# Patient Record
Sex: Female | Born: 1950
Health system: Southern US, Community
[De-identification: ages and names within clinical notes are randomized; demographics above are authoritative.]

## PROBLEM LIST (undated history)

## (undated) DIAGNOSIS — E785 Hyperlipidemia, unspecified: Secondary | ICD-10-CM

## (undated) DIAGNOSIS — J302 Other seasonal allergic rhinitis: Secondary | ICD-10-CM

## (undated) DIAGNOSIS — M810 Age-related osteoporosis without current pathological fracture: Secondary | ICD-10-CM

## (undated) HISTORY — DX: Hyperlipidemia, unspecified: E78.5

## (undated) HISTORY — DX: Age-related osteoporosis without current pathological fracture: M81.0

## (undated) HISTORY — PX: OTHER SURGICAL HISTORY: SHX169

## (undated) HISTORY — DX: Other seasonal allergic rhinitis: J30.2

## (undated) HISTORY — PX: BASAL CELL CARCINOMA EXCISION: SHX1214

---

## 1972-12-31 HISTORY — PX: WISDOM TOOTH EXTRACTION: SHX21

## 2009-12-18 ENCOUNTER — Emergency Department (HOSPITAL_BASED_OUTPATIENT_CLINIC_OR_DEPARTMENT_OTHER): Admission: EM | Admit: 2009-12-18 | Discharge: 2009-12-18 | Payer: Self-pay | Admitting: Emergency Medicine

## 2010-01-09 ENCOUNTER — Encounter: Admission: RE | Admit: 2010-01-09 | Discharge: 2010-01-09 | Payer: Self-pay | Admitting: Family Medicine

## 2010-11-27 ENCOUNTER — Emergency Department (HOSPITAL_BASED_OUTPATIENT_CLINIC_OR_DEPARTMENT_OTHER): Admission: EM | Admit: 2010-11-27 | Discharge: 2010-11-27 | Payer: Self-pay | Admitting: Emergency Medicine

## 2011-12-13 ENCOUNTER — Other Ambulatory Visit: Payer: Self-pay | Admitting: Endocrinology

## 2011-12-13 DIAGNOSIS — E049 Nontoxic goiter, unspecified: Secondary | ICD-10-CM

## 2011-12-18 ENCOUNTER — Ambulatory Visit
Admission: RE | Admit: 2011-12-18 | Discharge: 2011-12-18 | Disposition: A | Discharge status: Home / Self Care | Payer: 59 | Source: Ambulatory Visit | Attending: Endocrinology | Admitting: Endocrinology

## 2011-12-18 DIAGNOSIS — E049 Nontoxic goiter, unspecified: Secondary | ICD-10-CM

## 2013-09-30 ENCOUNTER — Other Ambulatory Visit: Payer: Self-pay | Admitting: Endocrinology

## 2013-09-30 DIAGNOSIS — E049 Nontoxic goiter, unspecified: Secondary | ICD-10-CM

## 2013-10-16 ENCOUNTER — Ambulatory Visit
Admission: RE | Admit: 2013-10-16 | Discharge: 2013-10-16 | Disposition: A | Discharge status: Home / Self Care | Payer: Managed Care, Other (non HMO) | Source: Ambulatory Visit | Attending: Endocrinology | Admitting: Endocrinology

## 2013-10-16 DIAGNOSIS — E049 Nontoxic goiter, unspecified: Secondary | ICD-10-CM

## 2015-06-30 ENCOUNTER — Other Ambulatory Visit: Payer: Self-pay | Admitting: Endocrinology

## 2015-06-30 DIAGNOSIS — E049 Nontoxic goiter, unspecified: Secondary | ICD-10-CM

## 2015-07-05 ENCOUNTER — Ambulatory Visit
Admission: RE | Admit: 2015-07-05 | Discharge: 2015-07-05 | Disposition: A | Discharge status: Home / Self Care | Payer: Managed Care, Other (non HMO) | Source: Ambulatory Visit | Attending: Endocrinology | Admitting: Endocrinology

## 2015-07-05 DIAGNOSIS — E049 Nontoxic goiter, unspecified: Secondary | ICD-10-CM

## 2016-10-26 ENCOUNTER — Other Ambulatory Visit: Payer: Self-pay | Admitting: Obstetrics and Gynecology

## 2016-10-26 DIAGNOSIS — M79621 Pain in right upper arm: Secondary | ICD-10-CM

## 2016-10-29 ENCOUNTER — Ambulatory Visit
Admission: RE | Admit: 2016-10-29 | Discharge: 2016-10-29 | Disposition: A | Discharge status: Home / Self Care | Payer: Managed Care, Other (non HMO) | Source: Ambulatory Visit | Attending: Obstetrics and Gynecology | Admitting: Obstetrics and Gynecology

## 2016-10-29 ENCOUNTER — Other Ambulatory Visit: Payer: Self-pay | Admitting: Obstetrics and Gynecology

## 2016-10-29 DIAGNOSIS — M79621 Pain in right upper arm: Secondary | ICD-10-CM

## 2016-11-13 LAB — HEMOGLOBIN A1C: Hemoglobin A1C: 5.1

## 2016-11-13 LAB — LIPID PANEL
Cholesterol: 234 — AB (ref 0–200)
HDL: 114 — AB (ref 35–70)
LDL CALC: 110
TRIGLYCERIDES: 51 (ref 40–160)

## 2016-11-13 LAB — VITAMIN D 25 HYDROXY (VIT D DEFICIENCY, FRACTURES): VIT D 25 HYDROXY: 32

## 2016-11-13 LAB — TSH: TSH: 2.41 (ref 0.41–5.90)

## 2017-03-13 ENCOUNTER — Other Ambulatory Visit: Payer: Self-pay | Admitting: Endocrinology

## 2017-03-13 DIAGNOSIS — E049 Nontoxic goiter, unspecified: Secondary | ICD-10-CM

## 2017-06-21 ENCOUNTER — Ambulatory Visit
Admission: RE | Admit: 2017-06-21 | Discharge: 2017-06-21 | Disposition: A | Discharge status: Home / Self Care | Payer: Managed Care, Other (non HMO) | Source: Ambulatory Visit | Attending: Endocrinology | Admitting: Endocrinology

## 2017-06-21 DIAGNOSIS — E049 Nontoxic goiter, unspecified: Secondary | ICD-10-CM

## 2018-05-13 LAB — LIPID PANEL
Cholesterol: 253 — AB (ref 0–200)
HDL: 94 — AB (ref 35–70)
LDL CALC: 143
Triglycerides: 64 (ref 40–160)

## 2018-05-13 LAB — HM PAP SMEAR: HM PAP: NEGATIVE

## 2018-05-13 LAB — HM MAMMOGRAPHY

## 2018-05-13 LAB — TSH: TSH: 2.06 (ref 0.41–5.90)

## 2018-05-13 LAB — VITAMIN D 25 HYDROXY (VIT D DEFICIENCY, FRACTURES): VIT D 25 HYDROXY: 33

## 2018-05-13 LAB — HEMOGLOBIN A1C: Hemoglobin A1C: 5.2

## 2018-06-11 LAB — HM DEXA SCAN

## 2018-08-25 DIAGNOSIS — E7800 Pure hypercholesterolemia, unspecified: Secondary | ICD-10-CM | POA: Diagnosis not present

## 2018-08-25 LAB — LIPID PANEL
CHOLESTEROL: 183 (ref 0–200)
HDL: 87 — AB (ref 35–70)
LDL CALC: 82
TRIGLYCERIDES: 70 (ref 40–160)

## 2018-09-30 ENCOUNTER — Ambulatory Visit: Payer: Self-pay | Admitting: Nurse Practitioner

## 2018-10-06 ENCOUNTER — Encounter: Payer: Self-pay | Admitting: Nurse Practitioner

## 2018-10-06 ENCOUNTER — Ambulatory Visit (INDEPENDENT_AMBULATORY_CARE_PROVIDER_SITE_OTHER): Payer: Medicare Other | Admitting: Nurse Practitioner

## 2018-10-06 VITALS — BP 120/76 | HR 73 | Temp 98.0°F | Ht 65.0 in | Wt 167.0 lb

## 2018-10-06 DIAGNOSIS — N3281 Overactive bladder: Secondary | ICD-10-CM | POA: Diagnosis not present

## 2018-10-06 DIAGNOSIS — Z6827 Body mass index (BMI) 27.0-27.9, adult: Secondary | ICD-10-CM | POA: Diagnosis not present

## 2018-10-06 DIAGNOSIS — J302 Other seasonal allergic rhinitis: Secondary | ICD-10-CM | POA: Diagnosis not present

## 2018-10-06 DIAGNOSIS — M810 Age-related osteoporosis without current pathological fracture: Secondary | ICD-10-CM | POA: Insufficient documentation

## 2018-10-06 DIAGNOSIS — E785 Hyperlipidemia, unspecified: Secondary | ICD-10-CM | POA: Insufficient documentation

## 2018-10-06 DIAGNOSIS — Z23 Encounter for immunization: Secondary | ICD-10-CM | POA: Diagnosis not present

## 2018-10-06 DIAGNOSIS — E782 Mixed hyperlipidemia: Secondary | ICD-10-CM

## 2018-10-06 MED ORDER — ZOSTER VAC RECOMB ADJUVANTED 50 MCG/0.5ML IM SUSR: 0.5000 mL | Freq: Once | INTRAMUSCULAR | 1 refills | Status: AC

## 2018-10-06 NOTE — Progress Notes (Signed)
Careteam: Patient Care Team: Lauree Chandler, NP as PCP - General (Geriatric Medicine) Hale Bogus., MD as Referring Physician (Gastroenterology) Marylynn Pearson, MD as Consulting Physician (Obstetrics and Gynecology) Bjorn Loser, MD as Consulting Physician (Urology) Jacelyn Pi, MD as Consulting Physician (Endocrinology)  Advanced Directive information Does Patient Have a Medical Advance Directive?: No  Allergies  Allergen Reactions  . Fish Allergy     Any fish in the flounder family causes rash, difficulty breathing, swelling of eyes, itching, water/swelling under eyes  . Penicillins     Swelling of the lips and a rash  . Tape     Localized rash    Chief Complaint  Patient presents with  . Medical Management of Chronic Issues    Pt is being seen so establish care. Pt has no previous PCP. Pt saw Dr. Claiborne Billings at [place] when she needed care.      HPI: Patient is a 67 y.o. female seen in the office today to establish care. Pt with another practice but not seen regularly. Has been seeing Dr Orvan Seen regularly who has done mammogram and bone density. Last PAP June or July of this year.   Dr Ivin Booty GI- has colonoscopy scheduled due to hx of polyps but none recently.   Osteoporsis- currently on fosamax, managed by Dr Orvan Seen. Has not been taking calicum and vit D - but has it at home. Has recently joined the gym for weight bearing activity.   Hyperlipidemia- taking atorvastatin 10 mg by mouth daily, at 3 month follow up LDL improved to 82 on last labs 07/2018  starting  routine exercise at [employer], likes to eat junk food. Lots of eating out.   Overactive bladder- following with urologist, on vesicare 10 mg daily  Seasonal allergies- taking claritin and flonase as needed   Hx of PCOS, partial hysterectomy.    Reports right lid issues- following with dermatologist- got steriodal cream that she will only use when it flares.  Has seen ophthalmologist but  ophthalmologist said it was contact dermatitis.  Mother recently died of dementia at 78 Review of Systems:  Review of Systems  Constitutional: Negative for chills, fever and weight loss.  HENT: Negative for tinnitus.   Respiratory: Negative for cough, sputum production and shortness of breath.   Cardiovascular: Negative for chest pain, palpitations and leg swelling.  Gastrointestinal: Negative for abdominal pain, constipation, diarrhea and heartburn.  Genitourinary: Negative for dysuria, frequency and urgency.  Musculoskeletal: Negative for back pain, falls, joint pain and myalgias.  Skin: Negative.   Neurological: Negative for dizziness and headaches.  Psychiatric/Behavioral: Negative for depression and memory loss. The patient does not have insomnia.     Past Medical History:  Diagnosis Date  . Hyperlipidemia   . Osteoporosis   . Seasonal allergies    Past Surgical History:  Procedure Laterality Date  . hysterectomy     Dr. Bartolo Darter  . WISDOM TOOTH EXTRACTION  1974   Social History:   reports that she has never smoked. She has never used smokeless tobacco. She reports that she drinks alcohol. She reports that she does not use drugs.  Family History  Problem Relation Age of Onset  . Dementia Mother 43  . Osteoporosis Mother   . Heart failure Father   . Atrial fibrillation Father   . Sick sinus syndrome Father   . Heart disease Father   . Diabetes Maternal Grandmother   . Arthritis Maternal Grandfather   . Dementia Maternal  Grandfather   . Cancer Paternal Grandmother   . Heart attack Paternal Uncle   . Stroke Paternal Aunt     Medications: Patient's Medications  New Prescriptions   No medications on file  Previous Medications   ALENDRONATE (FOSAMAX) 70 MG TABLET    TAKE 1 TABLET BY MOUTH ONCE WEEKLY   ATORVASTATIN (LIPITOR) 20 MG TABLET    Take 20 mg by mouth daily.   CALCIUM-PHOSPHORUS-VITAMIN D (CITRACAL +D3 PO)    Take 1 tablet by mouth daily.    CHOLECALCIFEROL (VITAMIN D3) 2000 UNITS TABS    Take 1 tablet by mouth daily.   FLUTICASONE (FLONASE) 50 MCG/ACT NASAL SPRAY    2 sprays by Each Nare route daily.   GLUCOSAMINE-CHONDROITIN PO    Take 1 tablet by mouth daily.   LORATADINE (CLARITIN) 10 MG TABLET    Take 10 mg by mouth.   SOLIFENACIN (VESICARE) 10 MG TABLET    Take 10 mg by mouth.   UNABLE TO FIND    Take 1 Dose by mouth daily. Nutrifil Optimal - V: heart, eyes, skin and lungs   UNABLE TO FIND    Take 1 Dose by mouth daily. Nutrifil Optimal- M: bones, nerves, and muscles  Modified Medications   Modified Medication Previous Medication   ZOSTER VACCINE ADJUVANTED (SHINGRIX) INJECTION Zoster Vaccine Adjuvanted [facility]) injection      Inject 0.5 mLs into the muscle once for 1 dose.    Inject 0.5 mLs into the muscle once.  Discontinued Medications   No medications on file     Physical Exam:  Vitals:   10/06/18 0910  BP: 120/76  Pulse: 73  Temp: 98 F (36.7 C)  TempSrc: Oral  SpO2: 97%  Weight: 167 lb (75.8 kg)  Height: 5\' 5"  (1.651 m)   Body mass index is 27.79 kg/m.  Physical Exam  Constitutional: She is oriented to person, place, and time. She appears well-developed and well-nourished. No distress.  HENT:  Head: Normocephalic and atraumatic.  Mouth/Throat: Oropharynx is clear and moist. No oropharyngeal exudate.  Eyes: Pupils are equal, round, and reactive to light. Conjunctivae are normal.  Neck: Normal range of motion. Neck supple.  Cardiovascular: Normal rate, regular rhythm and normal heart sounds.  Pulmonary/Chest: Effort normal and breath sounds normal.  Abdominal: Soft. Bowel sounds are normal.  Musculoskeletal: She exhibits no edema or tenderness.  Neurological: She is alert and oriented to person, place, and time.  Skin: Skin is warm and dry. She is not diaphoretic.  Psychiatric: She has a normal mood and affect.    Labs reviewed: Basic Metabolic Panel: No results for input(s): NA, K, CL, CO2,  GLUCOSE, BUN, CREATININE, CALCIUM, MG, PHOS, TSH in the last 8760 hours. Liver Function Tests: No results for input(s): AST, ALT, ALKPHOS, BILITOT, PROT, ALBUMIN in the last 8760 hours. No results for input(s): LIPASE, AMYLASE in the last 8760 hours. No results for input(s): AMMONIA in the last 8760 hours. CBC: No results for input(s): WBC, NEUTROABS, HGB, HCT, MCV, PLT in the last 8760 hours. Lipid Panel: No results for input(s): CHOL, HDL, LDLCALC, TRIG, CHOLHDL, LDLDIRECT in the last 8760 hours. TSH: No results for input(s): TSH in the last 8760 hours. A1C: No results found for: HGBA1C   Assessment/Plan 1. Need for influenza vaccination - Flu vaccine HIGH DOSE PF (Fluzone High dose)  2. Age-related osteoporosis without current pathological fracture Continues on fosamax, encouraged to take calcium and vit d routinely. To continue weight bearing activity.  3. Mixed hyperlipidemia Improvement in LDL on lipitor.   4. Overactive bladder Stable on vesicare, following with urology  5. Seasonal allergies -stable, uses claritin and flonase as needed  6. Body mass index 27.0-27.9, adult Starting more routine exercise and aware she needs to make dietary modifications.   Next appt: 4 weeks for welcome to medicare visit  Jessica K. [place], [facility] [phone]

## 2018-10-06 NOTE — Patient Instructions (Signed)
Will get flu shot today   When you come back we will get you pneumonia vaccine  Follow up in 4 weeks for welcome to medicare visit with Levi Crass (45 min appt)

## 2018-10-08 DIAGNOSIS — K5730 Diverticulosis of large intestine without perforation or abscess without bleeding: Secondary | ICD-10-CM | POA: Diagnosis not present

## 2018-10-08 DIAGNOSIS — Z1211 Encounter for screening for malignant neoplasm of colon: Secondary | ICD-10-CM | POA: Diagnosis not present

## 2018-10-08 DIAGNOSIS — D128 Benign neoplasm of rectum: Secondary | ICD-10-CM | POA: Diagnosis not present

## 2018-10-08 DIAGNOSIS — D126 Benign neoplasm of colon, unspecified: Secondary | ICD-10-CM | POA: Diagnosis not present

## 2018-10-08 DIAGNOSIS — K6389 Other specified diseases of intestine: Secondary | ICD-10-CM | POA: Diagnosis not present

## 2018-10-08 DIAGNOSIS — K621 Rectal polyp: Secondary | ICD-10-CM | POA: Diagnosis not present

## 2018-10-08 DIAGNOSIS — Z86010 Personal history of colonic polyps: Secondary | ICD-10-CM | POA: Diagnosis not present

## 2018-10-08 DIAGNOSIS — K648 Other hemorrhoids: Secondary | ICD-10-CM | POA: Diagnosis not present

## 2018-11-03 ENCOUNTER — Encounter: Payer: Self-pay | Admitting: Nurse Practitioner

## 2018-11-03 ENCOUNTER — Ambulatory Visit (INDEPENDENT_AMBULATORY_CARE_PROVIDER_SITE_OTHER): Payer: Medicare Other | Admitting: Nurse Practitioner

## 2018-11-03 ENCOUNTER — Ambulatory Visit
Admission: RE | Admit: 2018-11-03 | Discharge: 2018-11-03 | Disposition: A | Discharge status: Home / Self Care | Payer: Medicare Other | Source: Ambulatory Visit | Attending: Nurse Practitioner | Admitting: Nurse Practitioner

## 2018-11-03 ENCOUNTER — Other Ambulatory Visit: Payer: Self-pay | Admitting: Nurse Practitioner

## 2018-11-03 VITALS — BP 124/78 | HR 73 | Temp 98.3°F | Ht 65.0 in | Wt 164.4 lb

## 2018-11-03 DIAGNOSIS — M5137 Other intervertebral disc degeneration, lumbosacral region: Secondary | ICD-10-CM | POA: Diagnosis not present

## 2018-11-03 DIAGNOSIS — M549 Dorsalgia, unspecified: Principal | ICD-10-CM

## 2018-11-03 DIAGNOSIS — Z23 Encounter for immunization: Secondary | ICD-10-CM

## 2018-11-03 DIAGNOSIS — Z0000 Encounter for general adult medical examination without abnormal findings: Secondary | ICD-10-CM | POA: Diagnosis not present

## 2018-11-03 DIAGNOSIS — E782 Mixed hyperlipidemia: Secondary | ICD-10-CM | POA: Diagnosis not present

## 2018-11-03 DIAGNOSIS — M546 Pain in thoracic spine: Secondary | ICD-10-CM | POA: Diagnosis not present

## 2018-11-03 DIAGNOSIS — G8929 Other chronic pain: Secondary | ICD-10-CM

## 2018-11-03 DIAGNOSIS — M50323 Other cervical disc degeneration at C6-C7 level: Secondary | ICD-10-CM | POA: Diagnosis not present

## 2018-11-03 DIAGNOSIS — M50322 Other cervical disc degeneration at C5-C6 level: Secondary | ICD-10-CM | POA: Diagnosis not present

## 2018-11-03 DIAGNOSIS — M47812 Spondylosis without myelopathy or radiculopathy, cervical region: Secondary | ICD-10-CM | POA: Diagnosis not present

## 2018-11-03 DIAGNOSIS — M47817 Spondylosis without myelopathy or radiculopathy, lumbosacral region: Secondary | ICD-10-CM | POA: Diagnosis not present

## 2018-11-03 NOTE — Progress Notes (Signed)
Subjective:    Mary Short is a 67 y.o. female who presents for a Welcome to Medicare exam.   Cardiac Risk Factors include: advanced age (>48men, >73 women);dyslipidemia      Objective:    Today's Vitals   11/03/18 1044  BP: 124/78  Pulse: 73  Temp: 98.3 F (36.8 C)  TempSrc: Oral  SpO2: 96%  Weight: 164 lb 6.4 oz (74.6 kg)  Height: 5\' 5"  (1.651 m)  Body mass index is 27.36 kg/m.  Medications Outpatient Encounter Medications as of 11/03/2018  Medication Sig  . alendronate (FOSAMAX) 70 MG tablet TAKE 1 TABLET BY MOUTH ONCE WEEKLY  . atorvastatin (LIPITOR) 20 MG tablet Take 20 mg by mouth daily.  . Calcium-Phosphorus-Vitamin D (CITRACAL +D3 PO) Take 1 tablet by mouth daily.  . Cholecalciferol (VITAMIN D3) 2000 units TABS Take 1 tablet by mouth daily.  . fluticasone (FLONASE) 50 MCG/ACT nasal spray 2 sprays by Each Nare route daily.  Marland Kitchen GLUCOSAMINE-CHONDROITIN PO Take 1 tablet by mouth daily.  Marland Kitchen loratadine (CLARITIN) 10 MG tablet Take 10 mg by mouth.  . solifenacin (VESICARE) 10 MG tablet Take 10 mg by mouth.  Marland Kitchen UNABLE TO FIND Take 1 Dose by mouth daily. Nutrifil Optimal - V: heart, eyes, skin and lungs  . UNABLE TO FIND Take 1 Dose by mouth daily. Nutrifil Optimal- M: bones, nerves, and muscles   No facility-administered encounter medications on file as of 11/03/2018.      History: Past Medical History:  Diagnosis Date  . Hyperlipidemia   . Osteoporosis   . Seasonal allergies    Past Surgical History:  Procedure Laterality Date  . hysterectomy     Dr. Bartolo Darter  . WISDOM TOOTH EXTRACTION  1974    Family History  Problem Relation Age of Onset  . Dementia Mother 17  . Osteoporosis Mother   . Heart failure Father   . Atrial fibrillation Father   . Sick sinus syndrome Father   . Heart disease Father   . Diabetes Maternal Grandmother   . Arthritis Maternal Grandfather   . Dementia Maternal Grandfather   . Cancer Paternal Grandmother   . Heart attack  Paternal Uncle   . Stroke Paternal Aunt    Social History   Occupational History  . Not on file  Tobacco Use  . Smoking status: Never Smoker  . Smokeless tobacco: Never Used  Substance and Sexual Activity  . Alcohol use: Yes    Comment: generally 1 drink per month  . Drug use: Never  . Sexual activity: Not Currently    Tobacco Counseling Counseling given: Not Answered   Immunizations and Health Maintenance Immunization History  Administered Date(s) Administered  . Influenza, High Dose Seasonal PF 10/06/2018  . Influenza-Unspecified 10/29/2014, 12/17/2016  . Tdap 10/29/2014   Health Maintenance Due  Topic Date Due  . PAP SMEAR  07/25/1974  . COLONOSCOPY  07/26/2003  . PNA vac Low Risk Adult (1 of 2 - PCV13) 07/25/2018  . MAMMOGRAM  10/29/2018    Activities of Daily Living In your present state of health, do you have any difficulty performing the following activities: 11/03/2018  Hearing? Y  Comment would like to wait for referral to audiologist  Vision? Y  Comment problems readings with glasses, places to follow up wtih eye exam  Difficulty concentrating or making decisions? Y  Comment reports hx of ADHD  Walking or climbing stairs? Y  Comment has breathing issues with multiple stairs  Dressing or  bathing? N  Doing errands, shopping? N  Preparing Food and eating ? N  Using the Toilet? N  In the past six months, have you accidently leaked urine? Y  Do you have problems with loss of bowel control? N  Managing your Medications? N  Managing your Finances? N  Housekeeping or managing your Housekeeping? N  Some recent data might be hidden     Advanced Directives: Does Patient Have a Medical Advance Directive?: No Would patient like information on creating a medical advance directive?: Yes (MAU/Ambulatory/Procedural Areas - Information given)    Assessment:    This is a routine wellness examination for this patient   Vision/Hearing screen  Visual Acuity  Screening   Right eye Left eye Both eyes  Without correction:     With correction: 20/40 20/40 20/40   Comments: Plans to make a follow up with ophthalmetrist   Dietary issues and exercise activities discussed:  Current Exercise Habits: The patient does not participate in regular exercise at present, Exercise limited by: None identified  Goals    . Increase physical activity     Would like to do more lifestyle modification with exercise- more weight bearing activity.     . Weight (lb) < 150 lb (68 kg) (pt-stated)     Would like to lose 15 lbs       Depression Screen PHQ 2/9 Scores 10/06/2018  PHQ - 2 Score 0     Fall Risk Fall Risk  11/03/2018  Falls in the past year? 0    Cognitive Function: MMSE - Mini Mental State Exam 11/03/2018  Orientation to time 5  Orientation to Place 5  Registration 3  Attention/ Calculation 5  Recall 3  Language- name 2 objects 2  Language- repeat 1  Language- follow 3 step command 3  Language- read & follow direction 1  Write a sentence 1  Copy design 1  Total score 30        Patient Care Team: Lauree Chandler, NP as PCP - General (Geriatric Medicine) Hale Bogus., MD as Referring Physician (Gastroenterology) Marylynn Pearson, MD as Consulting Physician (Obstetrics and Gynecology) Bjorn Loser, MD as Consulting Physician (Urology) Jacelyn Pi, MD as Consulting Physician (Endocrinology)     Plan:    I have personally reviewed and noted the following in the patient's chart:   . Medical and social history . Use of alcohol, tobacco or illicit drugs  . Current medications and supplements . Functional ability and status . Nutritional status . Physical activity . Advanced directives . List of other physicians . Hospitalizations, surgeries, and ER visits in previous 12 months . Vitals . Screenings to include cognitive, depression, and falls . Referrals and appointments  In addition, I have reviewed and discussed  with patient certain preventive protocols, quality metrics, and best practice recommendations. A written personalized care plan for preventive services as well as general preventive health recommendations were provided to patient.     Lauree Chandler, NP 11/03/2018

## 2018-11-03 NOTE — Patient Instructions (Addendum)
To make follow up for back pain and to review lab work  Health Maintenance, Female Adopting a healthy lifestyle and getting preventive care can go a long way to promote health and wellness. Talk with your health care provider about what schedule of regular examinations is right for you. This is a good chance for you to check in with your provider about disease prevention and staying healthy. In between checkups, there are plenty of things you can do on your own. Experts have done a lot of research about which lifestyle changes and preventive measures are most likely to keep you healthy. Ask your health care provider for more information. Weight and diet Eat a healthy diet  Be sure to include plenty of vegetables, fruits, low-fat dairy products, and lean protein.  Do not eat a lot of foods high in solid fats, added sugars, or salt.  Get regular exercise. This is one of the most important things you can do for your health. ? Most adults should exercise for at least 150 minutes each week. The exercise should increase your heart rate and make you sweat (moderate-intensity exercise). ? Most adults should also do strengthening exercises at least twice a week. This is in addition to the moderate-intensity exercise.  Maintain a healthy weight  Body mass index (BMI) is a measurement that can be used to identify possible weight problems. It estimates body fat based on height and weight. Your health care provider can help determine your BMI and help you achieve or maintain a healthy weight.  For females 60 years of age and older: ? A BMI below 18.5 is considered underweight. ? A BMI of 18.5 to 24.9 is normal. ? A BMI of 25 to 29.9 is considered overweight. ? A BMI of 30 and above is considered obese.  Watch levels of cholesterol and blood lipids  You should start having your blood tested for lipids and cholesterol at 65 years of age, then have this test every 5 years.  You may need to have your  cholesterol levels checked more often if: ? Your lipid or cholesterol levels are high. ? You are older than 65 years of age. ? You are at high risk for heart disease.  Cancer screening Lung Cancer  Lung cancer screening is recommended for adults 75-20 years old who are at high risk for lung cancer because of a history of smoking.  A yearly low-dose CT scan of the lungs is recommended for people who: ? Currently smoke. ? Have quit within the past 15 years. ? Have at least a 30-pack-year history of smoking. A pack year is smoking an average of one pack of cigarettes a day for 1 year.  Yearly screening should continue until it has been 15 years since you quit.  Yearly screening should stop if you develop a health problem that would prevent you from having lung cancer treatment.  Breast Cancer  Practice breast self-awareness. This means understanding how your breasts normally appear and feel.  It also means doing regular breast self-exams. Let your health care provider know about any changes, no matter how small.  If you are in your 20s or 30s, you should have a clinical breast exam (CBE) by a health care provider every 1-3 years as part of a regular health exam.  If you are 4 or older, have a CBE every year. Also consider having a breast X-ray (mammogram) every year.  If you have a family history of breast cancer, talk to your  health care provider about genetic screening.  If you are at high risk for breast cancer, talk to your health care provider about having an MRI and a mammogram every year.  Breast cancer gene (BRCA) assessment is recommended for women who have family members with BRCA-related cancers. BRCA-related cancers include: ? Breast. ? Ovarian. ? Tubal. ? Peritoneal cancers.  Results of the assessment will determine the need for genetic counseling and BRCA1 and BRCA2 testing.  Cervical Cancer Your health care provider may recommend that you be screened regularly  for cancer of the pelvic organs (ovaries, uterus, and vagina). This screening involves a pelvic examination, including checking for microscopic changes to the surface of your cervix (Pap test). You may be encouraged to have this screening done every 3 years, beginning at age 79.  For women ages 29-65, health care providers may recommend pelvic exams and Pap testing every 3 years, or they may recommend the Pap and pelvic exam, combined with testing for human papilloma virus (HPV), every 5 years. Some types of HPV increase your risk of cervical cancer. Testing for HPV may also be done on women of any age with unclear Pap test results.  Other health care providers may not recommend any screening for nonpregnant women who are considered low risk for pelvic cancer and who do not have symptoms. Ask your health care provider if a screening pelvic exam is right for you.  If you have had past treatment for cervical cancer or a condition that could lead to cancer, you need Pap tests and screening for cancer for at least 20 years after your treatment. If Pap tests have been discontinued, your risk factors (such as having a new sexual partner) need to be reassessed to determine if screening should resume. Some women have medical problems that increase the chance of getting cervical cancer. In these cases, your health care provider may recommend more frequent screening and Pap tests.  Colorectal Cancer  This type of cancer can be detected and often prevented.  Routine colorectal cancer screening usually begins at 65 years of age and continues through 65 years of age.  Your health care provider may recommend screening at an earlier age if you have risk factors for colon cancer.  Your health care provider may also recommend using home test kits to check for hidden blood in the stool.  A small camera at the end of a tube can be used to examine your colon directly (sigmoidoscopy or colonoscopy). This is done to  check for the earliest forms of colorectal cancer.  Routine screening usually begins at age 48.  Direct examination of the colon should be repeated every 5-10 years through 65 years of age. However, you may need to be screened more often if early forms of precancerous polyps or small growths are found.  Skin Cancer  Check your skin from head to toe regularly.  Tell your health care provider about any new moles or changes in moles, especially if there is a change in a mole's shape or color.  Also tell your health care provider if you have a mole that is larger than the size of a pencil eraser.  Always use sunscreen. Apply sunscreen liberally and repeatedly throughout the day.  Protect yourself by wearing long sleeves, pants, a wide-brimmed hat, and sunglasses whenever you are outside.  Heart disease, diabetes, and high blood pressure  High blood pressure causes heart disease and increases the risk of stroke. High blood pressure is more likely to  develop in: ? People who have blood pressure in the high end of the normal range (130-139/85-89 mm Hg). ? People who are overweight or obese. ? People who are African American.  If you are 33-59 years of age, have your blood pressure checked every 3-5 years. If you are 66 years of age or older, have your blood pressure checked every year. You should have your blood pressure measured twice-once when you are at a hospital or clinic, and once when you are not at a hospital or clinic. Record the average of the two measurements. To check your blood pressure when you are not at a hospital or clinic, you can use: ? An automated blood pressure machine at a pharmacy. ? A home blood pressure monitor.  If you are between 20 years and 51 years old, ask your health care provider if you should take aspirin to prevent strokes.  Have regular diabetes screenings. This involves taking a blood sample to check your fasting blood sugar level. ? If you are at a  normal weight and have a low risk for diabetes, have this test once every three years after 65 years of age. ? If you are overweight and have a high risk for diabetes, consider being tested at a younger age or more often. Preventing infection Hepatitis B  If you have a higher risk for hepatitis B, you should be screened for this virus. You are considered at high risk for hepatitis B if: ? You were born in a country where hepatitis B is common. Ask your health care provider which countries are considered high risk. ? Your parents were born in a high-risk country, and you have not been immunized against hepatitis B (hepatitis B vaccine). ? You have HIV or AIDS. ? You use needles to inject street drugs. ? You live with someone who has hepatitis B. ? You have had sex with someone who has hepatitis B. ? You get hemodialysis treatment. ? You take certain medicines for conditions, including cancer, organ transplantation, and autoimmune conditions.  Hepatitis C  Blood testing is recommended for: ? Everyone born from [birth year] through 1963. ? Anyone with known risk factors for hepatitis C.  Sexually transmitted infections (STIs)  You should be screened for sexually transmitted infections (STIs) including gonorrhea and chlamydia if: ? You are sexually active and are younger than 65 years of age. ? You are older than 65 years of age and your health care provider tells you that you are at risk for this type of infection. ? Your sexual activity has changed since you were last screened and you are at an increased risk for chlamydia or gonorrhea. Ask your health care provider if you are at risk.  If you do not have HIV, but are at risk, it may be recommended that you take a prescription medicine daily to prevent HIV infection. This is called pre-exposure prophylaxis (PrEP). You are considered at risk if: ? You are sexually active and do not regularly use condoms or know the HIV status of your  partner(s). ? You take drugs by injection. ? You are sexually active with a partner who has HIV.  Talk with your health care provider about whether you are at high risk of being infected with HIV. If you choose to begin PrEP, you should first be tested for HIV. You should then be tested every 3 months for as long as you are taking PrEP. Pregnancy  If you are premenopausal and you may become pregnant,  ask your health care provider about preconception counseling.  If you may become pregnant, take 400 to 800 micrograms (mcg) of folic acid every day.  If you want to prevent pregnancy, talk to your health care provider about birth control (contraception). Osteoporosis and menopause  Osteoporosis is a disease in which the bones lose minerals and strength with aging. This can result in serious bone fractures. Your risk for osteoporosis can be identified using a bone density scan.  If you are 42 years of age or older, or if you are at risk for osteoporosis and fractures, ask your health care provider if you should be screened.  Ask your health care provider whether you should take a calcium or vitamin D supplement to lower your risk for osteoporosis.  Menopause may have certain physical symptoms and risks.  Hormone replacement therapy may reduce some of these symptoms and risks. Talk to your health care provider about whether hormone replacement therapy is right for you. Follow these instructions at home:  Schedule regular health, dental, and eye exams.  Stay current with your immunizations.  Do not use any tobacco products including cigarettes, chewing tobacco, or electronic cigarettes.  If you are pregnant, do not drink alcohol.  If you are breastfeeding, limit how much and how often you drink alcohol.  Limit alcohol intake to no more than 1 drink per day for nonpregnant women. One drink equals 12 ounces of beer, 5 ounces of wine, or 1 ounces of hard liquor.  Do not use street  drugs.  Do not share needles.  Ask your health care provider for help if you need support or information about quitting drugs.  Tell your health care provider if you often feel depressed.  Tell your health care provider if you have ever been abused or do not feel safe at home. This information is not intended to replace advice given to you by your health care provider. Make sure you discuss any questions you have with your health care provider. Document Released: 07/02/2011 Document Revised: 05/24/2016 Document Reviewed: 09/20/2015 Elsevier Interactive Patient Education  [employer].

## 2018-11-04 LAB — CBC WITH DIFFERENTIAL/PLATELET
BASOS ABS: 9 {cells}/uL (ref 0–200)
Basophils Relative: 0.3 %
EOS ABS: 90 {cells}/uL (ref 15–500)
Eosinophils Relative: 2.9 %
HEMATOCRIT: 44.4 % (ref 35.0–45.0)
Hemoglobin: 15.2 g/dL (ref 11.7–15.5)
LYMPHS ABS: 933 {cells}/uL (ref 850–3900)
MCH: 31.1 pg (ref 27.0–33.0)
MCHC: 34.2 g/dL (ref 32.0–36.0)
MCV: 90.8 fL (ref 80.0–100.0)
MPV: 9 fL (ref 7.5–12.5)
Monocytes Relative: 9.1 %
NEUTROS PCT: 57.6 %
Neutro Abs: 1786 cells/uL (ref 1500–7800)
Platelets: 214 10*3/uL (ref 140–400)
RBC: 4.89 10*6/uL (ref 3.80–5.10)
RDW: 11.9 % (ref 11.0–15.0)
TOTAL LYMPHOCYTE: 30.1 %
WBC mixed population: 282 cells/uL (ref 200–950)
WBC: 3.1 10*3/uL — ABNORMAL LOW (ref 3.8–10.8)

## 2018-11-04 LAB — COMPLETE METABOLIC PANEL WITH GFR
AG RATIO: 1.7 (calc) (ref 1.0–2.5)
ALKALINE PHOSPHATASE (APISO): 52 U/L (ref 33–130)
ALT: 32 U/L — ABNORMAL HIGH (ref 6–29)
AST: 28 U/L (ref 10–35)
Albumin: 4.6 g/dL (ref 3.6–5.1)
BUN: 19 mg/dL (ref 7–25)
CO2: 32 mmol/L (ref 20–32)
Calcium: 9.5 mg/dL (ref 8.6–10.4)
Chloride: 103 mmol/L (ref 98–110)
Creat: 0.81 mg/dL (ref 0.50–0.99)
GFR, EST NON AFRICAN AMERICAN: 76 mL/min/{1.73_m2} (ref >=60)
GFR, Est African American: 88 mL/min/{1.73_m2} (ref >=60)
GLOBULIN: 2.7 g/dL (ref 1.9–3.7)
Glucose, Bld: 82 mg/dL (ref 65–99)
Potassium: 4.2 mmol/L (ref 3.5–5.3)
SODIUM: 141 mmol/L (ref 135–146)
Total Bilirubin: 0.5 mg/dL (ref 0.2–1.2)
Total Protein: 7.3 g/dL (ref 6.1–8.1)

## 2018-11-06 ENCOUNTER — Encounter: Payer: Self-pay | Admitting: Nurse Practitioner

## 2018-11-06 ENCOUNTER — Ambulatory Visit (INDEPENDENT_AMBULATORY_CARE_PROVIDER_SITE_OTHER): Payer: Medicare Other | Admitting: Nurse Practitioner

## 2018-11-06 VITALS — BP 122/74 | HR 84 | Temp 98.4°F | Ht 65.0 in | Wt 166.6 lb

## 2018-11-06 DIAGNOSIS — M549 Dorsalgia, unspecified: Secondary | ICD-10-CM | POA: Diagnosis not present

## 2018-11-06 DIAGNOSIS — G8929 Other chronic pain: Secondary | ICD-10-CM

## 2018-11-06 MED ORDER — TIZANIDINE HCL 2 MG PO CAPS: 2.0000 mg | ORAL_CAPSULE | Freq: Three times a day (TID) | ORAL | 0 refills | Status: DC | PRN

## 2018-11-06 NOTE — Patient Instructions (Addendum)
Aleve 1 tablet twice daily routine for 7 days for flare Use tylenol 500 mg 1-2 tablets every 8 hours as needed for pain- (not to exceed 3000 mg in 24 hours)  To use heat to back ~30 mins three times daily and can use muscle rub AFTER heat to help with pain To use zanaflex 2 mg by mouth every 8 hours a needed muscle spasm

## 2018-11-06 NOTE — Progress Notes (Signed)
Careteam: Patient Care Team: Lauree Chandler, NP as PCP - General (Geriatric Medicine) Hale Bogus., MD as Referring Physician (Gastroenterology) Marylynn Pearson, MD as Consulting Physician (Obstetrics and Gynecology) Bjorn Loser, MD as Consulting Physician (Urology) Jacelyn Pi, MD as Consulting Physician (Endocrinology)  Advanced Directive information    Allergies  Allergen Reactions  . Fish Allergy     Any fish in the flounder family causes rash, difficulty breathing, swelling of eyes, itching, water/swelling under eyes  . Penicillins     Swelling of the lips and a rash  . Tape     Localized rash    Chief Complaint  Patient presents with  . Acute Visit    pt is being seen to discuss lab results and xray results.      HPI: Patient is a 67 y.o. female seen in the office today for lab results and xray.   Pt with chronic pain in her back, has known disc disease in her cervical spine but reports pain in worse in mid back through bra line. 10 days ago  (25th-27th of October) started having increased pain in mid back that she could not get under control for 3 days. Took muscle relaxer that she already had (cyclobenzaprine 10 mg) and ice to help get pain under control. No know injury but she was sewing and making halloween costumes for her grandchildren. Also use tylenol and aleve to help pain. Currently pain has improved but still there.   Some mild low back pain, had sciatica when she was in college. Is very careful with her lower back. Sits up straight and supports back with pillows. Review of Systems:  Review of Systems  Constitutional: Negative for chills, fever, malaise/fatigue and weight loss.  Musculoskeletal: Positive for back pain and myalgias. Negative for falls.    Past Medical History:  Diagnosis Date  . Hyperlipidemia   . Osteoporosis   . Seasonal allergies    Past Surgical History:  Procedure Laterality Date  . hysterectomy     Dr. Bartolo Darter  . WISDOM TOOTH EXTRACTION  1974   Social History:   reports that she has never smoked. She has never used smokeless tobacco. She reports that she drinks alcohol. She reports that she does not use drugs.  Family History  Problem Relation Age of Onset  . Dementia Mother 17  . Osteoporosis Mother   . Heart failure Father   . Atrial fibrillation Father   . Sick sinus syndrome Father   . Heart disease Father   . Diabetes Maternal Grandmother   . Arthritis Maternal Grandfather   . Dementia Maternal Grandfather   . Cancer Paternal Grandmother   . Heart attack Paternal Uncle   . Stroke Paternal Aunt     Medications: Patient's Medications  New Prescriptions   No medications on file  Previous Medications   ALENDRONATE (FOSAMAX) 70 MG TABLET    TAKE 1 TABLET BY MOUTH ONCE WEEKLY   ATORVASTATIN (LIPITOR) 20 MG TABLET    Take 20 mg by mouth daily.   CALCIUM-PHOSPHORUS-VITAMIN D (CITRACAL +D3 PO)    Take 1 tablet by mouth daily.   CHOLECALCIFEROL (VITAMIN D3) 2000 UNITS TABS    Take 1 tablet by mouth daily.   FLUTICASONE (FLONASE) 50 MCG/ACT NASAL SPRAY    2 sprays by Each Nare route daily.   GLUCOSAMINE-CHONDROITIN PO    Take 1 tablet by mouth daily.   LORATADINE (CLARITIN) 10 MG TABLET    Take  10 mg by mouth.   SOLIFENACIN (VESICARE) 10 MG TABLET    Take 10 mg by mouth.   UNABLE TO FIND    Take 1 Dose by mouth daily. Nutrifil Optimal - V: heart, eyes, skin and lungs   UNABLE TO FIND    Take 1 Dose by mouth daily. Nutrifil Optimal- M: bones, nerves, and muscles  Modified Medications   No medications on file  Discontinued Medications   No medications on file     Physical Exam:  Vitals:   11/06/18 1507  BP: 122/74  Pulse: 84  Temp: 98.4 F (36.9 C)  TempSrc: Oral  SpO2: 98%  Weight: 166 lb 9.6 oz (75.6 kg)  Height: 5\' 5"  (1.651 m)   Body mass index is 27.72 kg/m.  Physical Exam  Constitutional: She is oriented to person, place, and time. She appears  well-developed and well-nourished. No distress.  HENT:  Head: Normocephalic and atraumatic.  Mouth/Throat: Oropharynx is clear and moist. No oropharyngeal exudate.  Eyes: Pupils are equal, round, and reactive to light. Conjunctivae are normal.  Neck: Normal range of motion. Neck supple.  Cardiovascular: Normal rate, regular rhythm and normal heart sounds.  Pulmonary/Chest: Effort normal and breath sounds normal.  Abdominal: Soft. Bowel sounds are normal.  Musculoskeletal: She exhibits no edema.       Thoracic back: She exhibits tenderness.       Back:  Neurological: She is alert and oriented to person, place, and time.  Skin: Skin is warm and dry. She is not diaphoretic.  Psychiatric: She has a normal mood and affect.    Labs reviewed: Basic Metabolic Panel: Recent Labs    05/13/18 11/03/18 1157  NA  --  141  K  --  4.2  CL  --  103  CO2  --  32  GLUCOSE  --  82  BUN  --  19  CREATININE  --  0.81  CALCIUM  --  9.5  TSH 2.06  --    Liver Function Tests: Recent Labs    11/03/18 1157  AST 28  ALT 32*  BILITOT 0.5  PROT 7.3   No results for input(s): LIPASE, AMYLASE in the last 8760 hours. No results for input(s): AMMONIA in the last 8760 hours. CBC: Recent Labs    11/03/18 1157  WBC 3.1*  NEUTROABS 1,786  HGB 15.2  HCT 44.4  MCV 90.8  PLT 214   Lipid Panel: Recent Labs    05/13/18 08/25/18  CHOL 253* 183  HDL 94* 87*  LDLCALC 143 82  TRIG 64 70   TSH: Recent Labs    05/13/18  TSH 2.06   A1C: Lab Results  Component Value Date   HGBA1C 5.2 05/13/2018   Dg Cervical Spine Complete  Result Date: 11/03/2018 CLINICAL DATA:  Chronic pain for several years. EXAM: CERVICAL SPINE - COMPLETE 4+ VIEW COMPARISON:  None. FINDINGS: C1 to the superior endplate of T1 is imaged on the provided lateral radiograph. There is straightening and reversal of the expected cervical lordosis with kyphosis centered about the C4-C5 articulation. There is grade 1  anterolisthesis of C3 upon C4 measuring approximately 2 mm. The bilateral facets appear normally aligned. The dens appears normally positioned between the lateral masses of C1. Cervical vertebral body heights are preserved. Prevertebral soft tissues are normal. Moderate multilevel cervical spine DDD, worse at C5-C6 and C6-C7 with disc space height loss, endplate irregularity and sclerosis. Regional soft tissues appear normal. Limited visualization of the lung  apices is normal. IMPRESSION: 1. Straightening and reversal of the expected cervical lordosis, nonspecific though could be seen in setting of muscle spasm. 2. Moderate multilevel cervical spine DDD, worse at C5-C6 and C6-C7. Electronically Signed   By: Sandi Mariscal M.D.   On: 11/03/2018 15:04   Dg Thoracic Spine W/swimmers  Result Date: 11/03/2018 CLINICAL DATA:  Acute thoracolumbar pain for the past several months. EXAM: THORACIC SPINE - 3 VIEWS COMPARISON:  Cervical lumbar spine radiographs-earlier same day FINDINGS: Evaluation of the superior aspect of the thoracic spine as well as the cervicothoracic junction is degraded secondary to overlying osseous and soft tissue structures. There are 12 rib-bearing thoracic type vertebral bodies. Normal alignment of the thoracic spine. No definite evidence of anterolisthesis or retrolisthesis. No significant scoliotic curvature. Thoracic vertebral body heights appear preserved. Thoracic intervertebral disc space heights appear preserved. Limited visualization of the adjacent thorax is normal. Regional soft tissues appear normal. IMPRESSION: No explanation for patient's thoracic spine pain. Electronically Signed   By: Sandi Mariscal M.D.   On: 11/03/2018 14:59   Dg Lumbar Spine Complete  Result Date: 11/03/2018 CLINICAL DATA:  Chronic low back pain, worsened recently. EXAM: LUMBAR SPINE - COMPLETE 4+ VIEW COMPARISON:  None. FINDINGS: There are 5 non rib-bearing lumbar type vertebral bodies There is a mild scoliotic  curvature of the thoracolumbar spine with dominant caudal component convex the left measuring approximately 7 degrees (as measured from the superior endplate of T62 to the inferior endplate of L4), potentially positional. No anterolisthesis or retrolisthesis. No definite pars defects. Lumbar vertebral body heights appear preserved. Mild to moderate multilevel lumbar spine DDD, worse at L5-S1 with disc space height loss, endplate irregularity and sclerosis. Limited visualization of the bilateral SI joints is normal. Regional bowel gas pattern and soft tissues appear normal. IMPRESSION: Mild-to-moderate multilevel lumbar spine DDD, worse at L5-S1. Electronically Signed   By: Sandi Mariscal M.D.   On: 11/03/2018 15:01    Assessment/Plan 1. Chronic midline back pain, unspecified back location Aleve 1 tablet twice daily routine for 7 days for flare Use tylenol 500 mg 1-2 tablets every 8 hours as needed for pain- (not to exceed 3000 mg in 24 hours)  To use heat to back ~30 mins three times daily and can use muscle rub AFTER heat to help with pain To use zanaflex 2 mg by mouth every 8 hours a needed muscle spasm  - Ambulatory referral to Physical Therapy - tizanidine (ZANAFLEX) 2 MG capsule; Take 1 capsule (2 mg total) by mouth 3 (three) times daily as needed for muscle spasms.  Dispense: 60 capsule; Refill: 0  Next appt: as scheduled, sooner if needed  Vaibhav Fogleman K. [place], [facility] [phone]

## 2018-11-06 NOTE — Addendum Note (Signed)
Addended by: Lauree Chandler on: 11/06/2018 03:30 PM   Modules accepted: Level of Service

## 2018-11-19 ENCOUNTER — Ambulatory Visit: Payer: Medicare Other | Attending: Nurse Practitioner | Admitting: Physical Therapy

## 2018-11-19 ENCOUNTER — Encounter: Payer: Self-pay | Admitting: Physical Therapy

## 2018-11-19 DIAGNOSIS — M546 Pain in thoracic spine: Secondary | ICD-10-CM | POA: Diagnosis not present

## 2018-11-19 NOTE — Patient Instructions (Signed)

## 2018-11-19 NOTE — Therapy (Signed)
[place], [place], [address] Phone: [phone]   Fax:  [phone]  Physical Therapy Evaluation  Patient Details  Name: Mary Short MRN: [id number] Date of Birth: 05-17-1951 Referring Provider (PT): [name], NP   Encounter Date: 11/19/2018  PT End of Session - 11/19/18 0948    Visit Number  1    Number of Visits  12    Date for PT Re-Evaluation  01/14/19    Authorization Type  MCR    PT Start Time  0800    PT Stop Time  0855    PT Time Calculation (min)  55 min    Activity Tolerance  Patient tolerated treatment well    Behavior During Therapy  [facility] for tasks assessed/performed       Past Medical History:  Diagnosis Date  . Hyperlipidemia   . Osteoporosis   . Seasonal allergies     Past Surgical History:  Procedure Laterality Date  . hysterectomy     Dr. Bartolo Darter  . WISDOM TOOTH EXTRACTION  1974    There were no vitals filed for this visit.   Subjective Assessment - 11/19/18 0808    Subjective  Pt relays off an on muscle spasm and back pain. This flared up again back in October when she was sewing a lot. She had X-rays which showed cervical DDD, but negative for thoracic. 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     Pertinent History  PMH: cerv DDD on recent x-rays, Osteoporosis,    Limitations  Lifting;House hold activities    How long can you stand comfortably?  not limited    How long can you walk comfortably?  not limited    Diagnostic tests  xrays    Patient Stated Goals  get the pain down and manage it    Currently in Pain?  Yes    Pain Score  7    avg   Pain Location  Back    Pain Orientation  Right;Left;Mid    Pain Descriptors / Indicators  Spasm;Tightness;Sore    Pain Type  Chronic pain    Pain Radiating Towards  denies any N/T    Pain Onset  More than a month ago    Pain Frequency  Intermittent    Aggravating Factors   protraction with sitting and sewing, cleaning house    Pain Relieving Factors  meds, ice,heat,massage    Multiple Pain Sites  No         [facility] PT Assessment - 11/19/18 0001      Assessment   Medical Diagnosis  Thoracic pain    Referring Provider (PT)  [name], NP    Onset Date/Surgical Date  --   acute on chronic   Next MD Visit  6 months    Prior Therapy  PT 19 years ago for back      Precautions   Precautions  None      Restrictions   Weight Bearing Restrictions  No      Balance Screen   Has the patient fallen in the past 6 months  No      [place] residence    Additional Comments  stairs at home but no trouble with these      Prior Function   Level of Independence  Independent      Cognition   Overall Cognitive Status  Within Functional Limits for tasks assessed      Observation/Other Assessments   Focus on Therapeutic Outcomes (FOTO)   66%limited      ROM / Strength   AROM / PROM / Strength  AROM;Strength      AROM   AROM Assessment Site  Lumbar    Lumbar Flexion  WFL    Lumbar Extension  50%   pain both sides   Lumbar - Right Side Bend  50%   pain on Rt   Lumbar - Left Side Bend  75%    Lumbar - Right Rotation  50%   pain on Rt   Lumbar - Left Rotation  50%      Strength   Overall Strength Comments  UE strength 4+/5 grossly bilat shoulder and elbow      Flexibility   Soft Tissue Assessment /Muscle Length  --   tight lats and traps     Palpation   Spinal mobility  good mid-low T spine mobility, mild hypomobility upper T spine    Palpation comment  TTP traps,lats,rhomboids,supra/infraspinatus on Rt                Objective measurements completed on examination: See above findings.      [facility] Adult PT Treatment/Exercise - 11/19/18 0001      Self-Care   Self-Care  --   HEP, home TENS edu     Modalities   Modalities  Electrical  Stimulation;Moist Heat      Moist Heat Therapy   Number Minutes Moist Heat  12 Minutes    Moist Heat Location  Other (comment)   Thoracic     Electrical Stimulation   Electrical Stimulation Location  T spine Rt paraspinals, traps    Electrical Stimulation Action  IFC    Electrical Stimulation Parameters  tolerance, pt supine    Electrical Stimulation Goals  Pain             PT Education - 11/19/18 0850    Education Details  HEP,POC,TENS    Person(s) Educated  Patient    Methods  Explanation;Demonstration;Verbal cues;Handout    Comprehension  Verbalized understanding;Need further instruction       PT Short Term Goals - 11/19/18 0953      PT SHORT TERM GOAL #1   Title  Pt will be I and compliant with HEP.     Time  4    Period  Weeks        PT Long Term Goals - 11/19/18 0953      PT LONG TERM GOAL #1   Title  Pt will improve FOTO to less than 50% limited. 8 weeks 01/14/18    Status  New      PT LONG TERM GOAL #2   Title  Pt will improve lumbar ROM to [facility]. 8 weeks 01/14/18    Status  New      PT LONG TERM GOAL #3   Title  Pt will report no more than 3/10 pain with usual activity including sewing and housework. 8 weeks 01/14/18             Plan - 11/19/18 0949    Clinical Impression Statement  Pt presents with thoracic strain/sprain and spasm localized to Rt side. She had neg x-rays. She has decreased UE strength, increased thoracic muslce tightness and tenderness on Rt, decreased ROM and increased pain limiting her function.  She will benefit from killed PT to address her deficits.     History and Personal Factors relevant to plan of care:  PMH: cerv DDD on recent x-rays, Osteoporosis,    Clinical Presentation  Evolving    Clinical Presentation due to:  acute on chronic pain that has worsened    Clinical Decision Making  Moderate    Rehab Potential  Good    PT Frequency  2x / week    PT Duration  8 weeks    PT Treatment/Interventions   Cryotherapy;Electrical Stimulation;Iontophoresis 4mg /ml Dexamethasone;Moist Heat;Ultrasound;Therapeutic activities;Therapeutic exercise;Neuromuscular re-education;Manual techniques;Dry needling;Taping    PT Next Visit Plan  Thoracic and scapular stretching and strengthening, MT to Rt paraspinals, traps,lats, caution if try mobs due to osteoporosis/penia    Consulted and Agree with Plan of Care  Patient       Patient will benefit from skilled therapeutic intervention in order to improve the following deficits and impairments:  Decreased activity tolerance, Decreased endurance, Decreased range of motion, Decreased strength, Hypomobility, Impaired flexibility, Increased fascial restricitons, Increased muscle spasms, Pain  Visit Diagnosis: Pain in thoracic spine     Problem List Patient Active Problem List   Diagnosis Date Noted  . Osteoporosis 10/06/2018  . Hyperlipidemia 10/06/2018  . Overactive bladder 10/06/2018  . Seasonal allergies 10/06/2018    Debbe Odea, PT,DPT 11/19/2018, 10:03 AM  [place], [place], [address] Phone: [phone]   Fax:  [phone]  Name: Mary Short MRN: [id number] Date of Birth: August 22, 1951

## 2018-12-05 ENCOUNTER — Encounter: Payer: Self-pay | Admitting: Physical Therapy

## 2018-12-05 ENCOUNTER — Ambulatory Visit: Payer: Medicare Other | Attending: Nurse Practitioner | Admitting: Physical Therapy

## 2018-12-05 DIAGNOSIS — M62838 Other muscle spasm: Secondary | ICD-10-CM | POA: Insufficient documentation

## 2018-12-05 DIAGNOSIS — M546 Pain in thoracic spine: Secondary | ICD-10-CM | POA: Insufficient documentation

## 2018-12-05 NOTE — Therapy (Signed)
[place], [place], [address] Phone: [phone]   Fax:  [phone]  Physical Therapy Treatment  Patient Details  Name: Mary Short MRN: [id number] Date of Birth: Feb 28, 1951 Referring Provider (PT): [name], NP   Encounter Date: 12/05/2018  PT End of Session - 12/05/18 0835    Visit Number  2    Date for PT Re-Evaluation  01/14/19    Authorization Type  MCR    PT Start Time  0755    PT Stop Time  0851    PT Time Calculation (min)  56 min    Activity Tolerance  Patient tolerated treatment well    Behavior During Therapy  [facility] for tasks assessed/performed       Past Medical History:  Diagnosis Date  . Hyperlipidemia   . Osteoporosis   . Seasonal allergies     Past Surgical History:  Procedure Laterality Date  . hysterectomy     Dr. Bartolo Darter  . WISDOM TOOTH EXTRACTION  1974    There were no vitals filed for this visit.  Subjective Assessment - 12/05/18 0757    Subjective  Patient reports that she has been travelling and did well, but did not do much of the HEP.  She reports that cold weather does bother her.    Currently in Pain?  Yes    Pain Score  6     Pain Location  Back    Pain Orientation  Mid    Aggravating Factors   cold weather                       [facility] Adult PT Treatment/Exercise - 12/05/18 0001      Exercises   Exercises  Shoulder;Lumbar      Shoulder Exercises: Seated   Extension  Both;20 reps    Extension Weight (lbs)  3#    Row  Both;20 reps    Row Weight (lbs)  4    Horizontal ABduction  Both;20 reps    Horizontal ABduction Weight (lbs)  2      Shoulder Exercises: Standing   External Rotation  Both;20 reps;Theraband    Theraband Level (Shoulder External Rotation)  Level 2 (Red)    Other Standing Exercises  5# shrugs with upper trap and levator stretches      Shoulder Exercises: ROM/Strengthening   UBE (Upper Arm Bike)   4 minutes level 4    Lat Pull  1.5 plate;20 reps    Cybex Row  1 plate;20 reps      Moist Heat Therapy   Number Minutes Moist Heat  12 Minutes    Moist Heat Location  Cervical      Electrical Stimulation   Electrical Stimulation Location  T spine Rt paraspinals, traps    Electrical Stimulation Action  IFC    Electrical Stimulation Parameters  supine    Electrical Stimulation Goals  Pain      Manual Therapy   Manual Therapy  Manual Traction;Passive ROM    Passive ROM  shoulder depression, PROM to end range of the cervical area some gentle contract relax    Manual Traction  occipital release,                PT Short Term Goals - 11/19/18 0953      PT SHORT TERM GOAL #1   Title  Pt will be I and compliant with HEP.  Time  4    Period  Weeks        PT Long Term Goals - 12/05/18 4356      PT LONG TERM GOAL #1   Title  Pt will improve FOTO to less than 50% limited. 8 weeks 01/14/18    Status  On-going      PT LONG TERM GOAL #2   Title  Pt will improve lumbar ROM to [facility]. 8 weeks 01/14/18    Status  On-going            Plan - 12/05/18 0837    Clinical Impression Statement  Patient with tightness in the cervical spine especially right, she did well with the exercises, had some c/o tightness in the upper traps with the exercises, she has a very hard time relaxing and tends to elevate the shoulders    PT Next Visit Plan  continue to work on scapular stabilization    Consulted and Agree with Plan of Care  Patient       Patient will benefit from skilled therapeutic intervention in order to improve the following deficits and impairments:  Decreased activity tolerance, Decreased endurance, Decreased range of motion, Decreased strength, Hypomobility, Impaired flexibility, Increased fascial restricitons, Increased muscle spasms, Pain  Visit Diagnosis: Pain in thoracic spine     Problem List Patient Active Problem List   Diagnosis Date Noted  . Osteoporosis  10/06/2018  . Hyperlipidemia 10/06/2018  . Overactive bladder 10/06/2018  . Seasonal allergies 10/06/2018    Sumner Boast., PT 12/05/2018, 8:39 AM  [place], [place], [address] Phone: [phone]   Fax:  [phone]  Name: Mary Short MRN: [id number] Date of Birth: [birth year]

## 2018-12-08 ENCOUNTER — Encounter: Payer: Self-pay | Admitting: Physical Therapy

## 2018-12-08 ENCOUNTER — Ambulatory Visit: Payer: Medicare Other | Admitting: Physical Therapy

## 2018-12-08 DIAGNOSIS — M62838 Other muscle spasm: Secondary | ICD-10-CM | POA: Diagnosis not present

## 2018-12-08 DIAGNOSIS — M546 Pain in thoracic spine: Secondary | ICD-10-CM

## 2018-12-08 NOTE — Therapy (Signed)
[place], [place], [address] Phone: [phone]   Fax:  [phone]  Physical Therapy Treatment  Patient Details  Name: Mary Short MRN: [id number] Date of Birth: 04-22-1951 Referring Provider (PT): [name], NP   Encounter Date: 12/08/2018  PT End of Session - 12/08/18 0923    Visit Number  3    Date for PT Re-Evaluation  01/14/19    PT Start Time  0838    PT Stop Time  0935    PT Time Calculation (min)  57 min    Activity Tolerance  Patient tolerated treatment well    Behavior During Therapy  [facility] for tasks assessed/performed       Past Medical History:  Diagnosis Date  . Hyperlipidemia   . Osteoporosis   . Seasonal allergies     Past Surgical History:  Procedure Laterality Date  . hysterectomy     Dr. Bartolo Darter  . WISDOM TOOTH EXTRACTION  1974    There were no vitals filed for this visit.  Subjective Assessment - 12/08/18 0841    Subjective  Patient reports that she went to the gym and is more sore.  She reports that she may have done too much weight.  Reports more soreness in the neck and stiffness.  Head feels heavy    Currently in Pain?  Yes    Pain Score  6     Pain Location  Neck    Pain Descriptors / Indicators  Sore    Aggravating Factors   maybe the weights                       [facility] Adult PT Treatment/Exercise - 12/08/18 0001      Shoulder Exercises: Seated   Extension  Both;20 reps    Extension Weight (lbs)  3#    Row  Both;20 reps    Row Weight (lbs)  4    Horizontal ABduction  Both;20 reps    Horizontal ABduction Weight (lbs)  2      Shoulder Exercises: Standing   Other Standing Exercises  5# shrugs with upper trap and levator stretches      Shoulder Exercises: ROM/Strengthening   UBE (Upper Arm Bike)  5 minutes level 3    Lat Pull  1.5 plate;20 reps    Cybex Row  1 plate;20 reps      Moist Heat Therapy   Number  Minutes Moist Heat  12 Minutes    Moist Heat Location  Cervical      Electrical Stimulation   Electrical Stimulation Location  T spine Rt paraspinals, traps    Electrical Stimulation Action  IFC    Electrical Stimulation Parameters  supine    Electrical Stimulation Goals  Pain      Manual Therapy   Manual Therapy  Soft tissue mobilization    Soft tissue mobilization  to the right upper trap, cervical spine and into the rhomboids             PT Education - 12/08/18 0921    Education Details  gave information about Home traction and went over how to use    Person(s) Educated  Patient    Methods  Explanation    Comprehension  Verbalized understanding       PT Short Term Goals - 12/08/18 0926      PT SHORT TERM GOAL #1   Title  Pt will be I and compliant with HEP.     Status  Achieved        PT Long Term Goals - 12/05/18 0017      PT LONG TERM GOAL #1   Title  Pt will improve FOTO to less than 50% limited. 8 weeks 01/14/18    Status  On-going      PT LONG TERM GOAL #2   Title  Pt will improve lumbar ROM to [facility]. 8 weeks 01/14/18    Status  On-going            Plan - 12/08/18 0924    Clinical Impression Statement  Patient continues to have cervivcal tightness, she has significant knot in the right upper trap that is very tender.  Talked with her about DN, vs cervical traction, gave her information about both    PT Next Visit Plan  try dry needling, possibly traction    Consulted and Agree with Plan of Care  Patient       Patient will benefit from skilled therapeutic intervention in order to improve the following deficits and impairments:  Decreased activity tolerance, Decreased endurance, Decreased range of motion, Decreased strength, Hypomobility, Impaired flexibility, Increased fascial restricitons, Increased muscle spasms, Pain  Visit Diagnosis: Pain in thoracic spine  Other muscle spasm     Problem List Patient Active Problem List   Diagnosis Date  Noted  . Osteoporosis 10/06/2018  . Hyperlipidemia 10/06/2018  . Overactive bladder 10/06/2018  . Seasonal allergies 10/06/2018    Sumner Boast., PT 12/08/2018, 9:27 AM  [place], [place], [address] Phone: [phone]   Fax:  [phone]  Name: Mary Short MRN: [id number] Date of Birth: [birth year]

## 2018-12-08 NOTE — Patient Instructions (Signed)

## 2018-12-09 DIAGNOSIS — B078 Other viral warts: Secondary | ICD-10-CM | POA: Diagnosis not present

## 2018-12-09 DIAGNOSIS — L239 Allergic contact dermatitis, unspecified cause: Secondary | ICD-10-CM | POA: Diagnosis not present

## 2018-12-09 DIAGNOSIS — Z85828 Personal history of other malignant neoplasm of skin: Secondary | ICD-10-CM | POA: Diagnosis not present

## 2018-12-12 ENCOUNTER — Ambulatory Visit: Payer: Medicare Other | Admitting: Physical Therapy

## 2018-12-12 ENCOUNTER — Encounter: Payer: Self-pay | Admitting: Physical Therapy

## 2018-12-12 DIAGNOSIS — M62838 Other muscle spasm: Secondary | ICD-10-CM | POA: Diagnosis not present

## 2018-12-12 DIAGNOSIS — M546 Pain in thoracic spine: Secondary | ICD-10-CM

## 2018-12-12 NOTE — Therapy (Signed)
[place], [place], [address] Phone: [phone]   Fax:  [phone]  Physical Therapy Treatment  Patient Details  Name: Mary Short MRN: [id number] Date of Birth: 1951/06/10 Referring Provider (PT): [name], NP   Encounter Date: 12/12/2018  PT End of Session - 12/12/18 0841    Visit Number  4    Date for PT Re-Evaluation  01/14/19    PT Start Time  0758    PT Stop Time  0855    PT Time Calculation (min)  57 min    Activity Tolerance  Patient tolerated treatment well    Behavior During Therapy  [facility] for tasks assessed/performed       Past Medical History:  Diagnosis Date  . Hyperlipidemia   . Osteoporosis   . Seasonal allergies     Past Surgical History:  Procedure Laterality Date  . hysterectomy     Dr. Bartolo Darter  . WISDOM TOOTH EXTRACTION  1974    There were no vitals filed for this visit.  Subjective Assessment - 12/12/18 0801    Subjective  Patient reports that she had a second shingles shot and that she was very ill for a a few days.      Currently in Pain?  Yes    Pain Score  2     Pain Location  Neck                       [facility] Adult PT Treatment/Exercise - 12/12/18 0001      Shoulder Exercises: Supine   External Rotation  Both;20 reps;Theraband    Theraband Level (Shoulder External Rotation)  Level 2 (Red)    Other Supine Exercises  head on ball cervical retraction 3 ways      Shoulder Exercises: Standing   External Rotation  Both;20 reps;Theraband    Theraband Level (Shoulder External Rotation)  Level 2 (Red)    Other Standing Exercises  5# shrugs with upper trap and levator stretches    Other Standing Exercises  Money 20 reps, weighted ball overhead reach      Shoulder Exercises: ROM/Strengthening   UBE (Upper Arm Bike)  5 minutes level 3    Lat Pull  1.5 plate;20 reps    Cybex Row  1 plate;20 reps    "W" Arms  20 reps      Shoulder Exercises: Stretch   Corner Stretch  3 reps;20 seconds      Moist Heat Therapy   Number Minutes Moist Heat  12 Minutes    Moist Heat Location  Cervical      Electrical Stimulation   Electrical Stimulation Location  T spine Rt paraspinals, traps    Electrical Stimulation Action  IFC    Electrical Stimulation Parameters  supine    Electrical Stimulation Goals  Pain      Manual Therapy   Manual Therapy  Soft tissue mobilization    Soft tissue mobilization  to the right upper trap, cervical spine and into the rhomboids               PT Short Term Goals - 12/08/18 0926      PT SHORT TERM GOAL #1   Title  Pt will be I and compliant with HEP.     Status  Achieved        PT Long Term Goals - 12/12/18 2725      PT  LONG TERM GOAL #1   Title  Pt will improve FOTO to less than 50% limited. 8 weeks 01/14/18    Status  On-going      PT LONG TERM GOAL #2   Title  Pt will improve lumbar ROM to [facility]. 8 weeks 01/14/18    Status  Partially Met      PT LONG TERM GOAL #3   Title  Pt will report no more than 3/10 pain with usual activity including sewing and housework. 8 weeks 01/14/18    Status  On-going            Plan - 12/12/18 0842    Clinical Impression Statement  Patien reports that overall she is feeling much better, she c/o tightness in the pectoral area with the W exercises.  Gave her the corner stretches and she is very tight.  She tends to elevate the shoulders with exercises needing cues for retraction and not elevation, both verbal and tactile    PT Next Visit Plan  try dry needling, possibly traction    Consulted and Agree with Plan of Care  Patient       Patient will benefit from skilled therapeutic intervention in order to improve the following deficits and impairments:  Decreased activity tolerance, Decreased endurance, Decreased range of motion, Decreased strength, Hypomobility, Impaired flexibility, Increased fascial restricitons, Increased muscle  spasms, Pain  Visit Diagnosis: Pain in thoracic spine  Other muscle spasm     Problem List Patient Active Problem List   Diagnosis Date Noted  . Osteoporosis 10/06/2018  . Hyperlipidemia 10/06/2018  . Overactive bladder 10/06/2018  . Seasonal allergies 10/06/2018    Sumner Boast., PT 12/12/2018, 8:47 AM  [place], [place], [address] Phone: [phone]   Fax:  [phone]  Name: Mary Short MRN: [id number] Date of Birth: 1951/12/18

## 2018-12-15 ENCOUNTER — Encounter: Payer: Self-pay | Admitting: Physical Therapy

## 2018-12-15 ENCOUNTER — Ambulatory Visit: Payer: Medicare Other | Admitting: Physical Therapy

## 2018-12-15 DIAGNOSIS — M62838 Other muscle spasm: Secondary | ICD-10-CM

## 2018-12-15 DIAGNOSIS — M546 Pain in thoracic spine: Secondary | ICD-10-CM

## 2018-12-15 NOTE — Therapy (Signed)
[place], [place], [address] Phone: [phone]   Fax:  [phone]  Physical Therapy Treatment  Patient Details  Name: Mary Short MRN: [id number] Date of Birth: March 23, 1951 Referring Provider (PT): [name], NP   Encounter Date: 12/15/2018  PT End of Session - 12/15/18 0930    Visit Number  5    Date for PT Re-Evaluation  01/14/19    PT Start Time  0842    PT Stop Time  0943    PT Time Calculation (min)  61 min    Activity Tolerance  Patient tolerated treatment well    Behavior During Therapy  [facility] for tasks assessed/performed       Past Medical History:  Diagnosis Date  . Hyperlipidemia   . Osteoporosis   . Seasonal allergies     Past Surgical History:  Procedure Laterality Date  . hysterectomy     Dr. Bartolo Darter  . WISDOM TOOTH EXTRACTION  1974    There were no vitals filed for this visit.  Subjective Assessment - 12/15/18 0843    Subjective  Patient reports that she did a lot of sewing yesterday and was in a bad posistion, reports increased mid back pain with some pain increased on the right side    Currently in Pain?  Yes    Pain Score  4     Pain Location  Back    Pain Orientation  Upper;Mid    Pain Descriptors / Indicators  Aching    Aggravating Factors   sitting and bending over, sewing                       [facility] Adult PT Treatment/Exercise - 12/15/18 0001      Shoulder Exercises: Standing   Horizontal ABduction  Both;20 reps;Theraband    Theraband Level (Shoulder Horizontal ABduction)  Level 2 (Red)      Shoulder Exercises: ROM/Strengthening   UBE (Upper Arm Bike)  5 minutes level 3    Lat Pull  1.5 plate;20 reps    Cybex Row  1 plate;20 reps    "W" Arms  20 reps      Shoulder Exercises: Stretch   Corner Stretch  3 reps;20 seconds      Moist Heat Therapy   Number Minutes Moist Heat  15 Minutes    Moist Heat Location  Cervical       Electrical Stimulation   Electrical Stimulation Location  T spine Rt paraspinals, traps    Electrical Stimulation Action  IFC    Electrical Stimulation Parameters  supine    Electrical Stimulation Goals  Pain      Manual Therapy   Manual Therapy  Soft tissue mobilization    Soft tissue mobilization  to the right upper trap, cervical spine and into the rhomboids, more focus ont he rhomboids today       Trigger Point Dry Needling - 12/15/18 0929    Consent Given?  Yes    Education Handout Provided  Yes    Muscles Treated Upper Body  Upper trapezius;Rhomboids    Upper Trapezius Response  Twitch reponse elicited    Rhomboids Response  Twitch response elicited             PT Short Term Goals - 12/08/18 0926      PT SHORT TERM GOAL #1   Title  Pt will be I and compliant with HEP.  Status  Achieved        PT Long Term Goals - 12/12/18 0846      PT LONG TERM GOAL #1   Title  Pt will improve FOTO to less than 50% limited. 8 weeks 01/14/18    Status  On-going      PT LONG TERM GOAL #2   Title  Pt will improve lumbar ROM to [facility] 8 weeks 01/14/18    Status  Partially Met      PT LONG TERM GOAL #3   Title  Pt will report no more than 3/10 pain with usual activity including sewing and housework. 8 weeks 01/14/18    Status  On-going            Plan - 12/15/18 0930    Clinical Impression Statement  Patient with increased spasms in the upper trap and the right rhomboid.  She had good twitch response with DN.      PT Next Visit Plan  see how the DN did    Consulted and Agree with Plan of Care  Patient       Patient will benefit from skilled therapeutic intervention in order to improve the following deficits and impairments:  Decreased activity tolerance, Decreased endurance, Decreased range of motion, Decreased strength, Hypomobility, Impaired flexibility, Increased fascial restricitons, Increased muscle spasms, Pain  Visit Diagnosis: Pain in thoracic  spine  Other muscle spasm     Problem List Patient Active Problem List   Diagnosis Date Noted  . Osteoporosis 10/06/2018  . Hyperlipidemia 10/06/2018  . Overactive bladder 10/06/2018  . Seasonal allergies 10/06/2018    Sumner Boast., PT 12/15/2018, 9:31 AM  [place], [place], [address] Phone: [phone]   Fax:  [phone]  Name: Sheilia Reznick MRN: [id number] Date of Birth: 04/11/1951

## 2018-12-15 NOTE — Patient Instructions (Signed)

## 2018-12-19 ENCOUNTER — Encounter: Payer: Self-pay | Admitting: Physical Therapy

## 2018-12-19 ENCOUNTER — Ambulatory Visit: Payer: Medicare Other | Admitting: Physical Therapy

## 2018-12-19 DIAGNOSIS — M62838 Other muscle spasm: Secondary | ICD-10-CM

## 2018-12-19 DIAGNOSIS — M546 Pain in thoracic spine: Secondary | ICD-10-CM

## 2018-12-19 NOTE — Therapy (Signed)
[place] [address], [place], [address] Phone: [phone]   Fax:  [phone]  Physical Therapy Treatment  Patient Details  Name: Mary Short MRN: [id number] Date of Birth: [birth year] Referring Provider (PT): [name], NP   Encounter Date: 12/19/2018  PT End of Session - 12/19/18 0841    Visit Number  6    Date for PT Re-Evaluation  01/14/19    PT Start Time  0800    PT Stop Time  0900    PT Time Calculation (min)  60 min    Activity Tolerance  Patient tolerated treatment well    Behavior During Therapy  [facility] for tasks assessed/performed       Past Medical History:  Diagnosis Date  . Hyperlipidemia   . Osteoporosis   . Seasonal allergies     Past Surgical History:  Procedure Laterality Date  . hysterectomy     Dr. Bartolo Darter  . WISDOM TOOTH EXTRACTION  1974    There were no vitals filed for this visit.  Subjective Assessment - 12/19/18 0800    Subjective  Neck was feeling very good.  Mid back is still hurting    Currently in Pain?  Yes    Pain Score  4     Pain Location  Back    Pain Relieving Factors  what we are doing helps                       [facility] Adult PT Treatment/Exercise - 12/19/18 0001      Shoulder Exercises: Standing   Horizontal ABduction  Both;20 reps;Theraband    External Rotation  Both;20 reps;Theraband    Theraband Level (Shoulder External Rotation)  Level 2 (Red)    Other Standing Exercises  Money 20 reps, weighted ball overhead reach      Shoulder Exercises: ROM/Strengthening   UBE (Upper Arm Bike)  6 minutes level 3    Lat Pull  1.5 plate;20 reps    Cybex Row  1.5 plate;20 reps    "W" Arms  20 reps      Shoulder Exercises: Stretch   Corner Stretch  3 reps;20 seconds      Moist Heat Therapy   Number Minutes Moist Heat  15 Minutes    Moist Heat Location  Cervical      Electrical Stimulation   Electrical Stimulation Location   right rhomboids    Electrical Stimulation Action  IFC    Electrical Stimulation Parameters  supine    Electrical Stimulation Goals  Pain      Manual Therapy   Manual Therapy  Soft tissue mobilization    Soft tissue mobilization  Gentle PA and rotational mobs of the mid thoracic area, STM to the rhomboids and longissimus in this area       Trigger Point Dry Needling - 12/19/18 0840    Consent Given?  Yes    Upper Trapezius Response  Palpable increased muscle length    Rhomboids Response  Palpable increased muscle length             PT Short Term Goals - 12/08/18 0926      PT SHORT TERM GOAL #1   Title  Pt will be I and compliant with HEP.     Status  Achieved        PT Long Term Goals - 12/19/18 0844      PT LONG TERM  GOAL #2   Title  Pt will improve lumbar ROM to [facility]. 8 weeks 01/14/18    Status  Partially Met      PT LONG TERM GOAL #3   Title  Pt will report no more than 3/10 pain with usual activity including sewing and housework. 8 weeks 01/14/18    Status  Partially Met            Plan - 12/19/18 0842    Clinical Impression Statement  Patient continues to report doing well with neck but some increased right rhomboid area pain,  This area has spasms and is tender, tried some gentle joint mobs and resisted breathing to this area.    PT Next Visit Plan  work on the rhomboid area    Consulted and Agree with Plan of Care  Patient       Patient will benefit from skilled therapeutic intervention in order to improve the following deficits and impairments:  Decreased activity tolerance, Decreased endurance, Decreased range of motion, Decreased strength, Hypomobility, Impaired flexibility, Increased fascial restricitons, Increased muscle spasms, Pain  Visit Diagnosis: Pain in thoracic spine  Other muscle spasm     Problem List Patient Active Problem List   Diagnosis Date Noted  . Osteoporosis 10/06/2018  . Hyperlipidemia 10/06/2018  . Overactive bladder  10/06/2018  . Seasonal allergies 10/06/2018    Sumner Boast., PT 12/19/2018, 8:45 AM  [facility] [place], [place], [address] Phone: [phone]   Fax:  [phone]  Name: Mary Short MRN: [id number] Date of Birth: 1951-01-22

## 2018-12-22 ENCOUNTER — Ambulatory Visit: Payer: Medicare Other | Admitting: Physical Therapy

## 2018-12-22 ENCOUNTER — Encounter: Payer: Self-pay | Admitting: Physical Therapy

## 2018-12-22 DIAGNOSIS — M62838 Other muscle spasm: Secondary | ICD-10-CM

## 2018-12-22 DIAGNOSIS — M546 Pain in thoracic spine: Secondary | ICD-10-CM | POA: Diagnosis not present

## 2018-12-22 NOTE — Therapy (Signed)
[place], [place], [address] Phone: [phone]   Fax:  [phone]  Physical Therapy Treatment  Patient Details  Name: Mary Short MRN: [id number] Date of Birth: 27-Oct-1951 Referring Provider (PT): [name], NP   Encounter Date: 12/22/2018  PT End of Session - 12/22/18 0837    Visit Number  7    Date for PT Re-Evaluation  01/14/19    PT Start Time  0752    PT Stop Time  0852    PT Time Calculation (min)  60 min    Activity Tolerance  Patient tolerated treatment well    Behavior During Therapy  [facility] for tasks assessed/performed       Past Medical History:  Diagnosis Date  . Hyperlipidemia   . Osteoporosis   . Seasonal allergies     Past Surgical History:  Procedure Laterality Date  . hysterectomy     Dr. Bartolo Darter  . WISDOM TOOTH EXTRACTION  1974    There were no vitals filed for this visit.  Subjective Assessment - 12/22/18 0755    Subjective  I was a little sore after last time, but the next day it was good.  It is monday and it is raining, so a little sore today    Currently in Pain?  Yes    Pain Score  2     Pain Location  Back    Pain Orientation  Mid    Aggravating Factors   sitting, wrapping presents                       [facility] Adult PT Treatment/Exercise - 12/22/18 0001      Shoulder Exercises: ROM/Strengthening   UBE (Upper Arm Bike)  6 minutes level 3    Lat Pull  1.5 plate;20 reps    Cybex Row  1.5 plate;20 reps    "W" Arms  20 reps      Shoulder Exercises: Isometric Strengthening   Other Isometric Exercises  overhead 6# carry      Shoulder Exercises: Stretch   Corner Stretch  3 reps;20 seconds      Moist Heat Therapy   Number Minutes Moist Heat  15 Minutes    Moist Heat Location  Cervical      Electrical Stimulation   Electrical Stimulation Location  right rhomboids    Electrical Stimulation Action  IFC    Electrical  Stimulation Parameters  supine    Electrical Stimulation Goals  Pain      Manual Therapy   Manual Therapy  Soft tissue mobilization    Soft tissue mobilization  Gentle PA and rotational mobs of the mid thoracic area, STM to the rhomboids and longissimus in this area               PT Short Term Goals - 12/08/18 0926      PT SHORT TERM GOAL #1   Title  Pt will be I and compliant with HEP.     Status  Achieved        PT Long Term Goals - 12/22/18 7793      PT LONG TERM GOAL #1   Title  Pt will improve FOTO to less than 50% limited. 8 weeks 01/14/18    Status  Partially Met      PT LONG TERM GOAL #2   Title  Pt will improve lumbar ROM to [facility]. 8 weeks  01/14/18    Status  Partially Met            Plan - 12/22/18 0837    Clinical Impression Statement  Overall patient is pleased with her progress, she is still having some right rhomboid pain and there are obvious spasms in the area.  She will be doing a lot of traveling so we went over posture and easy exercises to do in the car to help with posture and pain    PT Next Visit Plan  work on the rhomboid area    Consulted and Agree with Plan of Care  Patient       Patient will benefit from skilled therapeutic intervention in order to improve the following deficits and impairments:  Decreased activity tolerance, Decreased endurance, Decreased range of motion, Decreased strength, Hypomobility, Impaired flexibility, Increased fascial restricitons, Increased muscle spasms, Pain  Visit Diagnosis: Pain in thoracic spine  Other muscle spasm     Problem List Patient Active Problem List   Diagnosis Date Noted  . Osteoporosis 10/06/2018  . Hyperlipidemia 10/06/2018  . Overactive bladder 10/06/2018  . Seasonal allergies 10/06/2018    Sumner Boast., PT 12/22/2018, 8:39 AM  [place], [place], [address] Phone: [phone]   Fax:   [phone]  Name: Mary Short MRN: [id number] Date of Birth: [birth year]

## 2018-12-26 ENCOUNTER — Ambulatory Visit: Payer: Medicare Other | Admitting: Physical Therapy

## 2019-03-04 DIAGNOSIS — H01119 Allergic dermatitis of unspecified eye, unspecified eyelid: Secondary | ICD-10-CM | POA: Diagnosis not present

## 2019-05-18 ENCOUNTER — Ambulatory Visit: Payer: Self-pay | Admitting: Nurse Practitioner

## 2019-05-18 ENCOUNTER — Ambulatory Visit (INDEPENDENT_AMBULATORY_CARE_PROVIDER_SITE_OTHER): Payer: Medicare Other | Admitting: Nurse Practitioner

## 2019-05-18 ENCOUNTER — Encounter: Payer: Self-pay | Admitting: Nurse Practitioner

## 2019-05-18 ENCOUNTER — Other Ambulatory Visit: Payer: Self-pay

## 2019-05-18 VITALS — BP 120/80 | HR 67 | Temp 98.1°F | Ht 65.0 in | Wt 170.2 lb

## 2019-05-18 DIAGNOSIS — M549 Dorsalgia, unspecified: Secondary | ICD-10-CM | POA: Diagnosis not present

## 2019-05-18 DIAGNOSIS — J302 Other seasonal allergic rhinitis: Secondary | ICD-10-CM | POA: Diagnosis not present

## 2019-05-18 DIAGNOSIS — E782 Mixed hyperlipidemia: Secondary | ICD-10-CM

## 2019-05-18 DIAGNOSIS — G8929 Other chronic pain: Secondary | ICD-10-CM | POA: Diagnosis not present

## 2019-05-18 DIAGNOSIS — M810 Age-related osteoporosis without current pathological fracture: Secondary | ICD-10-CM | POA: Diagnosis not present

## 2019-05-18 DIAGNOSIS — N3281 Overactive bladder: Secondary | ICD-10-CM | POA: Diagnosis not present

## 2019-05-18 LAB — COMPLETE METABOLIC PANEL WITH GFR
AG Ratio: 2 (calc) (ref 1.0–2.5)
ALT: 22 U/L (ref 6–29)
AST: 23 U/L (ref 10–35)
Albumin: 4.5 g/dL (ref 3.6–5.1)
Alkaline phosphatase (APISO): 39 U/L (ref 37–153)
BUN: 17 mg/dL (ref 7–25)
CO2: 32 mmol/L (ref 20–32)
Calcium: 9.4 mg/dL (ref 8.6–10.4)
Chloride: 106 mmol/L (ref 98–110)
Creat: 0.82 mg/dL (ref 0.50–0.99)
GFR, Est African American: 87 mL/min/{1.73_m2} (ref >=60)
GFR, Est Non African American: 75 mL/min/{1.73_m2} (ref >=60)
Globulin: 2.2 g/dL (calc) (ref 1.9–3.7)
Glucose, Bld: 94 mg/dL (ref 65–99)
Potassium: 4.3 mmol/L (ref 3.5–5.3)
Sodium: 142 mmol/L (ref 135–146)
Total Bilirubin: 0.5 mg/dL (ref 0.2–1.2)
Total Protein: 6.7 g/dL (ref 6.1–8.1)

## 2019-05-18 LAB — CBC WITH DIFFERENTIAL/PLATELET
Absolute Monocytes: 287 cells/uL (ref 200–950)
Basophils Absolute: 21 cells/uL (ref 0–200)
Basophils Relative: 0.6 %
Eosinophils Absolute: 140 cells/uL (ref 15–500)
Eosinophils Relative: 4 %
HCT: 40.1 % (ref 35.0–45.0)
Hemoglobin: 13.7 g/dL (ref 11.7–15.5)
Lymphs Abs: 910 cells/uL (ref 850–3900)
MCH: 31.1 pg (ref 27.0–33.0)
MCHC: 34.2 g/dL (ref 32.0–36.0)
MCV: 91.1 fL (ref 80.0–100.0)
MPV: 9 fL (ref 7.5–12.5)
Monocytes Relative: 8.2 %
Neutro Abs: 2142 cells/uL (ref 1500–7800)
Neutrophils Relative %: 61.2 %
Platelets: 188 10*3/uL (ref 140–400)
RBC: 4.4 10*6/uL (ref 3.80–5.10)
RDW: 12.1 % (ref 11.0–15.0)
Total Lymphocyte: 26 %
WBC: 3.5 10*3/uL — ABNORMAL LOW (ref 3.8–10.8)

## 2019-05-18 LAB — LIPID PANEL
Cholesterol: 188 mg/dL (ref <200)
HDL: 84 mg/dL (ref >=50)
LDL Cholesterol (Calc): 90 mg/dL (calc)
Non-HDL Cholesterol (Calc): 104 mg/dL (calc) (ref <130)
Total CHOL/HDL Ratio: 2.2 (calc) (ref <5.0)
Triglycerides: 55 mg/dL (ref <150)

## 2019-05-18 NOTE — Progress Notes (Signed)
Careteam: Patient Care Team: Lauree Chandler, NP as PCP - General (Geriatric Medicine) Hale Bogus., MD as Referring Physician (Gastroenterology) Marylynn Pearson, MD as Consulting Physician (Obstetrics and Gynecology) Bjorn Loser, MD as Consulting Physician (Urology) Jacelyn Pi, MD as Consulting Physician (Endocrinology)  Advanced Directive information Does Patient Have a Medical Advance Directive?: No, Does patient want to make changes to medical advance directive?: No - Patient declined  Allergies  Allergen Reactions  . Fish Allergy     Any fish in the flounder family causes rash, difficulty breathing, swelling of eyes, itching, water/swelling under eyes  . Penicillins     Swelling of the lips and a rash  . Tape     Localized rash    Chief Complaint  Patient presents with  . Medical Management of Chronic Issues    6 month follow-up. Patient c/o weight gain and no motivation   . Medication Refill    refill Lipitor #90 at [facility]   . Medication Management    Zanalex not helping      HPI: Patient is a 68 y.o. female seen in the office today for routine follow up   Dr Ivin Booty GI- had follow up with colonoscopy. Up to date.   Osteoporsis- currently on fosamax, managed by Dr Orvan Seen who is following bone density. Taking her calicum and vit D.   Hyperlipidemia- taking atorvastatin 10 mg by mouth daily, at 3 month follow up LDL improved to 82 on last labs 07/2018  Overactive bladder- following with urologist, controlled on vesicare 10 mg daily  Seasonal allergies- stable, taking claritin and flonase as needed   Reports right lid issues- following with dermatologist- got steriodal cream that she will only use when it flares.  Has seen ophthalmologist but ophthalmologist said it was contact dermatitis.  Chronic Midline back pain- flare in November and earlier this month. Did tylenol and aleve with heat and cold.  Took zquil and slept through the  pain.  Muscle relaxer did not help.  PT helped in the past. Had not been doing her shoulder exercise.  Warm water helped and ice packed helped.   Not exercise during quarantine.  Review of Systems:  Review of Systems  Constitutional: Negative for chills, fever, malaise/fatigue and weight loss.  HENT: Negative for tinnitus.   Respiratory: Negative for cough, sputum production and shortness of breath.   Cardiovascular: Negative for chest pain, palpitations and leg swelling.  Gastrointestinal: Negative for abdominal pain, constipation, diarrhea and heartburn.  Genitourinary: Negative for dysuria, frequency and urgency.  Musculoskeletal: Positive for back pain and myalgias. Negative for falls and joint pain.  Skin: Negative.   Neurological: Negative for dizziness and headaches.  Psychiatric/Behavioral: Negative for depression and memory loss. The patient does not have insomnia.     Past Medical History:  Diagnosis Date  . Hyperlipidemia   . Osteoporosis   . Seasonal allergies    Past Surgical History:  Procedure Laterality Date  . hysterectomy     Dr. Bartolo Darter  . WISDOM TOOTH EXTRACTION  1974   Social History:   reports that she has never smoked. She has never used smokeless tobacco. She reports current alcohol use. She reports that she does not use drugs.  Family History  Problem Relation Age of Onset  . Dementia Mother 45  . Osteoporosis Mother   . Heart failure Father   . Atrial fibrillation Father   . Sick sinus syndrome Father   . Heart disease Father   .  Diabetes Maternal Grandmother   . Arthritis Maternal Grandfather   . Dementia Maternal Grandfather   . Cancer Paternal Grandmother   . Heart attack Paternal Uncle   . Stroke Paternal Aunt     Medications: Patient's Medications  New Prescriptions   No medications on file  Previous Medications   ALENDRONATE (FOSAMAX) 70 MG TABLET    TAKE 1 TABLET BY MOUTH ONCE WEEKLY   ATORVASTATIN (LIPITOR) 20 MG  TABLET    Take 20 mg by mouth daily.   CALCIUM-PHOSPHORUS-VITAMIN D (CITRACAL +D3 PO)    Take 1 tablet by mouth daily.   CHOLECALCIFEROL (VITAMIN D3) 2000 UNITS TABS    Take 1 tablet by mouth daily.   FLUTICASONE (FLONASE) 50 MCG/ACT NASAL SPRAY    Place 2 sprays into both nostrils daily as needed. As needed   GLUCOSAMINE-CHONDROITIN PO    Take 1 tablet by mouth daily.   LORATADINE (CLARITIN) 10 MG TABLET    Take 10 mg by mouth as needed.    OMEGA-3 FATTY ACIDS (FISH OIL PO)    Take by mouth daily.   SOLIFENACIN (VESICARE) 10 MG TABLET    Take 10 mg by mouth.   TIZANIDINE (ZANAFLEX) 2 MG CAPSULE    Take 1 capsule (2 mg total) by mouth 3 (three) times daily as needed for muscle spasms.   UNABLE TO FIND    Take 1 Dose by mouth daily. Nutrifil Optimal - V: heart, eyes, skin and lungs   UNABLE TO FIND    Take 1 Dose by mouth daily. Nutrifil Optimal- M: bones, nerves, and muscles  Modified Medications   No medications on file  Discontinued Medications   No medications on file     Physical Exam:  Vitals:   05/18/19 1038  BP: 120/80  Pulse: 67  Temp: 98.1 F (36.7 C)  TempSrc: Oral  SpO2: 98%  Weight: 170 lb 3.2 oz (77.2 kg)  Height: 5\' 5"  (1.651 m)   Body mass index is 28.32 kg/m.  Physical Exam Constitutional:      General: She is not in acute distress.    Appearance: She is well-developed. She is not diaphoretic.  HENT:     Head: Normocephalic and atraumatic.     Mouth/Throat:     Pharynx: No oropharyngeal exudate.  Eyes:     Conjunctiva/sclera: Conjunctivae normal.     Pupils: Pupils are equal, round, and reactive to light.  Neck:     Musculoskeletal: Normal range of motion and neck supple.  Cardiovascular:     Rate and Rhythm: Normal rate and regular rhythm.     Heart sounds: Normal heart sounds.  Pulmonary:     Effort: Pulmonary effort is normal.     Breath sounds: Normal breath sounds.  Abdominal:     General: Bowel sounds are normal.     Palpations: Abdomen is  soft.  Musculoskeletal:        General: No tenderness.  Skin:    General: Skin is warm and dry.  Neurological:     Mental Status: She is alert and oriented to person, place, and time.    Labs reviewed: Basic Metabolic Panel: Recent Labs    11/03/18 1157  NA 141  K 4.2  CL 103  CO2 32  GLUCOSE 82  BUN 19  CREATININE 0.81  CALCIUM 9.5   Liver Function Tests: Recent Labs    11/03/18 1157  AST 28  ALT 32*  BILITOT 0.5  PROT 7.3   No  results for input(s): LIPASE, AMYLASE in the last 8760 hours. No results for input(s): AMMONIA in the last 8760 hours. CBC: Recent Labs    11/03/18 1157  WBC 3.1*  NEUTROABS 1,786  HGB 15.2  HCT 44.4  MCV 90.8  PLT 214   Lipid Panel: Recent Labs    08/25/18  CHOL 183  HDL 87*  LDLCALC 82  TRIG 70   TSH: No results for input(s): TSH in the last 8760 hours. A1C: Lab Results  Component Value Date   HGBA1C 5.2 05/13/2018     Assessment/Plan 1. Chronic midline back pain, unspecified back location -stable, recent flare earlier this month but currently without acute pain. Encouraged routine exercise to help strengthen back. - CBC with Differential/Platelet  2. Mixed hyperlipidemia -continues on Lipitor with dietary modifications.  - Lipid Panel - COMPLETE METABOLIC PANEL WITH GFR  3. Age-related osteoporosis without current pathological fracture Following with GYN. Continues on fosamax, calcium and vit D. Needs to increase   4. Overactive bladder Controlled on vesicare daily  5. Seasonal allergies Stable, uses Claritin as needed, has not needed to use flonase  Next appt: 6 months for routine follow up [place] [place] [phone]

## 2019-05-18 NOTE — Patient Instructions (Signed)
Increase exercise. Goal 30 mins 5 days a week.   DASH Eating Plan DASH stands for "Dietary Approaches to Stop Hypertension." The DASH eating plan is a healthy eating plan that has been shown to reduce high blood pressure (hypertension). It may also reduce your risk for type 2 diabetes, heart disease, and stroke. The DASH eating plan may also help with weight loss. What are tips for following this plan?  General guidelines  Avoid eating more than 2,300 mg (milligrams) of salt (sodium) a day. If you have hypertension, you may need to reduce your sodium intake to 1,500 mg a day.  Limit alcohol intake to no more than 1 drink a day for nonpregnant women and 2 drinks a day for men. One drink equals 12 oz of beer, 5 oz of wine, or 1 oz of hard liquor.  Work with your health care provider to maintain a healthy body weight or to lose weight. Ask what an ideal weight is for you.  Get at least 30 minutes of exercise that causes your heart to beat faster (aerobic exercise) most days of the week. Activities may include walking, swimming, or biking.  Work with your health care provider or diet and nutrition specialist (dietitian) to adjust your eating plan to your individual calorie needs. Reading food labels   Check food labels for the amount of sodium per serving. Choose foods with less than 5 percent of the Daily Value of sodium. Generally, foods with less than 300 mg of sodium per serving fit into this eating plan.  To find whole grains, look for the word "whole" as the first word in the ingredient list. Shopping  Buy products labeled as "low-sodium" or "no salt added."  Buy fresh foods. Avoid canned foods and premade or frozen meals. Cooking  Avoid adding salt when cooking. Use salt-free seasonings or herbs instead of table salt or sea salt. Check with your health care provider or pharmacist before using salt substitutes.  Do not fry foods. Cook foods using healthy methods such as baking,  boiling, grilling, and broiling instead.  Cook with heart-healthy oils, such as olive, canola, soybean, or sunflower oil. Meal planning  Eat a balanced diet that includes: ? 5 or more servings of fruits and vegetables each day. At each meal, try to fill half of your plate with fruits and vegetables. ? Up to 6-8 servings of whole grains each day. ? Less than 6 oz of lean meat, poultry, or fish each day. A 3-oz serving of meat is about the same size as a deck of cards. One egg equals 1 oz. ? 2 servings of low-fat dairy each day. ? A serving of nuts, seeds, or beans 5 times each week. ? Heart-healthy fats. Healthy fats called Omega-3 fatty acids are found in foods such as flaxseeds and coldwater fish, like sardines, salmon, and mackerel.  Limit how much you eat of the following: ? Canned or prepackaged foods. ? Food that is high in trans fat, such as fried foods. ? Food that is high in saturated fat, such as fatty meat. ? Sweets, desserts, sugary drinks, and other foods with added sugar. ? Full-fat dairy products.  Do not salt foods before eating.  Try to eat at least 2 vegetarian meals each week.  Eat more home-cooked food and less restaurant, buffet, and fast food.  When eating at a restaurant, ask that your food be prepared with less salt or no salt, if possible. What foods are recommended? The items listed  may not be a complete list. Talk with your dietitian about what dietary choices are best for you. Grains Whole-grain or whole-wheat bread. Whole-grain or whole-wheat pasta. Brown rice. [place]row. Bulgur. Whole-grain and low-sodium cereals. Pita bread. Low-fat, low-sodium crackers. Whole-wheat flour tortillas. Vegetables Fresh or frozen vegetables (raw, steamed, roasted, or grilled). Low-sodium or reduced-sodium tomato and vegetable juice. Low-sodium or reduced-sodium tomato sauce and tomato paste. Low-sodium or reduced-sodium canned vegetables. Fruits All fresh, dried, or  frozen fruit. Canned fruit in natural juice (without added sugar). Meat and other protein foods Skinless chicken or [place]. Ground chicken or [place]. Pork with fat trimmed off. Fish and seafood. Egg whites. Dried beans, peas, or lentils. Unsalted nuts, nut butters, and seeds. Unsalted canned beans. Lean cuts of beef with fat trimmed off. Low-sodium, lean deli meat. Dairy Low-fat (1%) or fat-free (skim) milk. Fat-free, low-fat, or reduced-fat cheeses. Nonfat, low-sodium ricotta or cottage cheese. Low-fat or nonfat yogurt. Low-fat, low-sodium cheese. Fats and oils Soft margarine without trans fats. Vegetable oil. Low-fat, reduced-fat, or light mayonnaise and salad dressings (reduced-sodium). Canola, safflower, olive, soybean, and sunflower oils. Avocado. Seasoning and other foods Herbs. Spices. Seasoning mixes without salt. Unsalted popcorn and pretzels. Fat-free sweets. What foods are not recommended? The items listed may not be a complete list. Talk with your dietitian about what dietary choices are best for you. Grains Baked goods made with fat, such as croissants, muffins, or some breads. Dry pasta or rice meal packs. Vegetables Creamed or fried vegetables. Vegetables in a cheese sauce. Regular canned vegetables (not low-sodium or reduced-sodium). Regular canned tomato sauce and paste (not low-sodium or reduced-sodium). Regular tomato and vegetable juice (not low-sodium or reduced-sodium). Angie Fava. Olives. Fruits Canned fruit in a light or heavy syrup. Fried fruit. Fruit in cream or butter sauce. Meat and other protein foods Fatty cuts of meat. Ribs. Fried meat. Berniece Salines. Sausage. Bologna and other processed lunch meats. Salami. Fatback. Hotdogs. Bratwurst. Salted nuts and seeds. Canned beans with added salt. Canned or smoked fish. Whole eggs or egg yolks. Chicken or [place] with skin. Dairy Whole or 2% milk, cream, and half-and-half. Whole or full-fat cream cheese. Whole-fat or sweetened yogurt.  Full-fat cheese. Nondairy creamers. Whipped toppings. Processed cheese and cheese spreads. Fats and oils Butter. Stick margarine. Lard. Shortening. Ghee. Bacon fat. Tropical oils, such as coconut, palm kernel, or palm oil. Seasoning and other foods Salted popcorn and pretzels. Onion salt, garlic salt, seasoned salt, table salt, and sea salt. Worcestershire sauce. Tartar sauce. Barbecue sauce. Teriyaki sauce. Soy sauce, including reduced-sodium. Steak sauce. Canned and packaged gravies. Fish sauce. Oyster sauce. Cocktail sauce. Horseradish that you find on the shelf. Ketchup. Mustard. Meat flavorings and tenderizers. Bouillon cubes. Hot sauce and Tabasco sauce. Premade or packaged marinades. Premade or packaged taco seasonings. Relishes. Regular salad dressings. Where to find more information:  National Heart, Lung, and [place]: https://wilson-eaton.com/  American Heart Association: www.heart.org Summary  The DASH eating plan is a healthy eating plan that has been shown to reduce high blood pressure (hypertension). It may also reduce your risk for type 2 diabetes, heart disease, and stroke.  With the DASH eating plan, you should limit salt (sodium) intake to 2,300 mg a day. If you have hypertension, you may need to reduce your sodium intake to 1,500 mg a day.  When on the DASH eating plan, aim to eat more fresh fruits and vegetables, whole grains, lean proteins, low-fat dairy, and heart-healthy fats.  Work with your health care provider or diet  and nutrition specialist (dietitian) to adjust your eating plan to your individual calorie needs. This information is not intended to replace advice given to you by your health care provider. Make sure you discuss any questions you have with your health care provider. Document Released: 12/06/2011 Document Revised: 12/10/2016 Document Reviewed: 12/10/2016 Elsevier Interactive Patient Education  2019 [employer].

## 2019-05-20 ENCOUNTER — Other Ambulatory Visit: Payer: Self-pay

## 2019-05-20 MED ORDER — ATORVASTATIN CALCIUM 20 MG PO TABS: 20.0000 mg | ORAL_TABLET | Freq: Every day | ORAL | 1 refills | Status: DC

## 2019-06-30 DIAGNOSIS — Z6829 Body mass index (BMI) 29.0-29.9, adult: Secondary | ICD-10-CM | POA: Diagnosis not present

## 2019-06-30 DIAGNOSIS — Z1231 Encounter for screening mammogram for malignant neoplasm of breast: Secondary | ICD-10-CM | POA: Diagnosis not present

## 2019-06-30 DIAGNOSIS — M8580 Other specified disorders of bone density and structure, unspecified site: Secondary | ICD-10-CM | POA: Diagnosis not present

## 2019-06-30 DIAGNOSIS — Z01419 Encounter for gynecological examination (general) (routine) without abnormal findings: Secondary | ICD-10-CM | POA: Diagnosis not present

## 2019-07-01 DIAGNOSIS — R350 Frequency of micturition: Secondary | ICD-10-CM | POA: Diagnosis not present

## 2019-07-01 DIAGNOSIS — N3946 Mixed incontinence: Secondary | ICD-10-CM | POA: Diagnosis not present

## 2019-07-06 DIAGNOSIS — E049 Nontoxic goiter, unspecified: Secondary | ICD-10-CM | POA: Diagnosis not present

## 2019-07-06 DIAGNOSIS — E039 Hypothyroidism, unspecified: Secondary | ICD-10-CM | POA: Diagnosis not present

## 2019-07-14 ENCOUNTER — Other Ambulatory Visit: Payer: Self-pay | Admitting: Endocrinology

## 2019-07-14 DIAGNOSIS — E049 Nontoxic goiter, unspecified: Secondary | ICD-10-CM

## 2019-07-22 ENCOUNTER — Ambulatory Visit
Admission: RE | Admit: 2019-07-22 | Discharge: 2019-07-22 | Disposition: A | Discharge status: Home / Self Care | Payer: Medicare Other | Source: Ambulatory Visit | Attending: Endocrinology | Admitting: Endocrinology

## 2019-07-22 ENCOUNTER — Other Ambulatory Visit: Payer: Self-pay

## 2019-07-22 ENCOUNTER — Other Ambulatory Visit: Payer: Self-pay | Admitting: *Deleted

## 2019-07-22 DIAGNOSIS — E049 Nontoxic goiter, unspecified: Secondary | ICD-10-CM

## 2019-07-22 DIAGNOSIS — E041 Nontoxic single thyroid nodule: Secondary | ICD-10-CM | POA: Diagnosis not present

## 2019-07-22 MED ORDER — ATORVASTATIN CALCIUM 20 MG PO TABS: 20.0000 mg | ORAL_TABLET | Freq: Every day | ORAL | 1 refills | Status: DC

## 2019-07-22 NOTE — Telephone Encounter (Signed)
Received fax request from [facility].

## 2019-10-12 DIAGNOSIS — Z23 Encounter for immunization: Secondary | ICD-10-CM | POA: Diagnosis not present

## 2019-11-09 ENCOUNTER — Ambulatory Visit: Payer: Self-pay

## 2019-11-09 ENCOUNTER — Other Ambulatory Visit: Payer: Self-pay

## 2019-11-09 ENCOUNTER — Ambulatory Visit (INDEPENDENT_AMBULATORY_CARE_PROVIDER_SITE_OTHER): Payer: Medicare Other | Admitting: Nurse Practitioner

## 2019-11-09 ENCOUNTER — Encounter: Payer: Self-pay | Admitting: Nurse Practitioner

## 2019-11-09 VITALS — BP 118/76 | HR 77 | Temp 98.0°F | Ht 65.0 in | Wt 172.0 lb

## 2019-11-09 DIAGNOSIS — Z0001 Encounter for general adult medical examination with abnormal findings: Secondary | ICD-10-CM

## 2019-11-09 DIAGNOSIS — E782 Mixed hyperlipidemia: Secondary | ICD-10-CM

## 2019-11-09 DIAGNOSIS — Z23 Encounter for immunization: Secondary | ICD-10-CM

## 2019-11-09 DIAGNOSIS — M810 Age-related osteoporosis without current pathological fracture: Secondary | ICD-10-CM | POA: Diagnosis not present

## 2019-11-09 DIAGNOSIS — D729 Disorder of white blood cells, unspecified: Secondary | ICD-10-CM | POA: Diagnosis not present

## 2019-11-09 DIAGNOSIS — Z1159 Encounter for screening for other viral diseases: Secondary | ICD-10-CM

## 2019-11-09 DIAGNOSIS — Z0000 Encounter for general adult medical examination without abnormal findings: Secondary | ICD-10-CM

## 2019-11-09 NOTE — Patient Instructions (Addendum)
[name] , Thank you for taking time to come for your Medicare Wellness Visit. I appreciate your ongoing commitment to your health goals. Please review the following plan we discussed and let me know if I can assist you in the future.   Screening recommendations/referrals: Colonoscopy up to date Mammogram up to date  Bone Density up to date Recommended yearly ophthalmology/optometry visit for glaucoma screening and checkup Recommended yearly dental visit for hygiene and checkup  Vaccinations: Influenza vaccine up to date Pneumococcal vaccine up to date, Pneumococcal 23 given today  Tdap vaccine up to date Shingles vaccine up to date    Advanced directives: recommended you complete and bring back to office to have on fie.   Conditions/risks identified: weight gain noted, encouraged to increase physical activity   Next appointment: 1 year   Preventive Care 65 Years and Older, Female Preventive care refers to lifestyle choices and visits with your health care provider that can promote health and wellness. What does preventive care include?  A yearly physical exam. This is also called an annual well check.  Dental exams once or twice a year.  Routine eye exams. Ask your health care provider how often you should have your eyes checked.  Personal lifestyle choices, including:  Daily care of your teeth and gums.  Regular physical activity.  Eating a healthy diet.  Avoiding tobacco and drug use.  Limiting alcohol use.  Practicing safe sex.  Taking low-dose aspirin every day.  Taking vitamin and mineral supplements as recommended by your health care provider. What happens during an annual well check? The services and screenings done by your health care provider during your annual well check will depend on your age, overall health, lifestyle risk factors, and family history of disease. Counseling  Your health care provider may ask you questions about your:  Alcohol  use.  Tobacco use.  Drug use.  Emotional well-being.  Home and relationship well-being.  Sexual activity.  Eating habits.  History of falls.  Memory and ability to understand (cognition).  Work and work Statistician.  Reproductive health. Screening  You may have the following tests or measurements:  Height, weight, and BMI.  Blood pressure.  Lipid and cholesterol levels. These may be checked every 5 years, or more frequently if you are over 14 years old.  Skin check.  Lung cancer screening. You may have this screening every year starting at age 10 if you have a 30-pack-year history of smoking and currently smoke or have quit within the past 15 years.  Fecal occult blood test (FOBT) of the stool. You may have this test every year starting at age 36.  Flexible sigmoidoscopy or colonoscopy. You may have a sigmoidoscopy every 5 years or a colonoscopy every 10 years starting at age 23.  Hepatitis C blood test.  Hepatitis B blood test.  Sexually transmitted disease (STD) testing.  Diabetes screening. This is done by checking your blood sugar (glucose) after you have not eaten for a while (fasting). You may have this done every 1-3 years.  Bone density scan. This is done to screen for osteoporosis. You may have this done starting at age 36.  Mammogram. This may be done every 1-2 years. Talk to your health care provider about how often you should have regular mammograms. Talk with your health care provider about your test results, treatment options, and if necessary, the need for more tests. Vaccines  Your health care provider may recommend certain vaccines, such as:  Influenza  vaccine. This is recommended every year.  Tetanus, diphtheria, and acellular pertussis (Tdap, Td) vaccine. You may need a Td booster every 10 years.  Zoster vaccine. You may need this after age 85.  Pneumococcal 13-valent conjugate (PCV13) vaccine. One dose is recommended after age 85.   Pneumococcal polysaccharide (PPSV23) vaccine. One dose is recommended after age 70. Talk to your health care provider about which screenings and vaccines you need and how often you need them. This information is not intended to replace advice given to you by your health care provider. Make sure you discuss any questions you have with your health care provider. Document Released: 01/13/2016 Document Revised: 09/05/2016 Document Reviewed: 10/18/2015 Elsevier Interactive Patient Education  2017 [employer] Prevention in the Home Falls can cause injuries. They can happen to people of all ages. There are many things you can do to make your home safe and to help prevent falls. What can I do on the outside of my home?  Regularly fix the edges of walkways and driveways and fix any cracks.  Remove anything that might make you trip as you walk through a door, such as a raised step or threshold.  Trim any bushes or trees on the path to your home.  Use bright outdoor lighting.  Clear any walking paths of anything that might make someone trip, such as rocks or tools.  Regularly check to see if handrails are loose or broken. Make sure that both sides of any steps have handrails.  Any raised decks and porches should have guardrails on the edges.  Have any leaves, snow, or ice cleared regularly.  Use sand or salt on walking paths during winter.  Clean up any spills in your garage right away. This includes oil or grease spills. What can I do in the bathroom?  Use night lights.  Install grab bars by the toilet and in the tub and shower. Do not use towel bars as grab bars.  Use non-skid mats or decals in the tub or shower.  If you need to sit down in the shower, use a plastic, non-slip stool.  Keep the floor dry. Clean up any water that spills on the floor as soon as it happens.  Remove soap buildup in the tub or shower regularly.  Attach bath mats securely with double-sided non-slip  rug tape.  Do not have throw rugs and other things on the floor that can make you trip. What can I do in the bedroom?  Use night lights.  Make sure that you have a light by your bed that is easy to reach.  Do not use any sheets or blankets that are too big for your bed. They should not hang down onto the floor.  Have a firm chair that has side arms. You can use this for support while you get dressed.  Do not have throw rugs and other things on the floor that can make you trip. What can I do in the kitchen?  Clean up any spills right away.  Avoid walking on wet floors.  Keep items that you use a lot in easy-to-reach places.  If you need to reach something above you, use a strong step stool that has a grab bar.  Keep electrical cords out of the way.  Do not use floor polish or wax that makes floors slippery. If you must use wax, use non-skid floor wax.  Do not have throw rugs and other things on the floor that can  make you trip. What can I do with my stairs?  Do not leave any items on the stairs.  Make sure that there are handrails on both sides of the stairs and use them. Fix handrails that are broken or loose. Make sure that handrails are as long as the stairways.  Check any carpeting to make sure that it is firmly attached to the stairs. Fix any carpet that is loose or worn.  Avoid having throw rugs at the top or bottom of the stairs. If you do have throw rugs, attach them to the floor with carpet tape.  Make sure that you have a light switch at the top of the stairs and the bottom of the stairs. If you do not have them, ask someone to add them for you. What else can I do to help prevent falls?  Wear shoes that:  Do not have high heels.  Have rubber bottoms.  Are comfortable and fit you well.  Are closed at the toe. Do not wear sandals.  If you use a stepladder:  Make sure that it is fully opened. Do not climb a closed stepladder.  Make sure that both sides of  the stepladder are locked into place.  Ask someone to hold it for you, if possible.  Clearly mark and make sure that you can see:  Any grab bars or handrails.  First and last steps.  Where the edge of each step is.  Use tools that help you move around (mobility aids) if they are needed. These include:  Canes.  Walkers.  Scooters.  Crutches.  Turn on the lights when you go into a dark area. Replace any light bulbs as soon as they burn out.  Set up your furniture so you have a clear path. Avoid moving your furniture around.  If any of your floors are uneven, fix them.  If there are any pets around you, be aware of where they are.  Review your medicines with your doctor. Some medicines can make you feel dizzy. This can increase your chance of falling. Ask your doctor what other things that you can do to help prevent falls. This information is not intended to replace advice given to you by your health care provider. Make sure you discuss any questions you have with your health care provider. Document Released: 10/13/2009 Document Revised: 05/24/2016 Document Reviewed: 01/21/2015 Elsevier Interactive Patient Education  2017 [employer].

## 2019-11-09 NOTE — Progress Notes (Signed)
Subjective:   Mary Short is a 68 y.o. female who presents for Medicare Annual (Subsequent) preventive examination.  Review of Systems:   Cardiac Risk Factors include: dyslipidemia;advanced age (>36men, >53 women);sedentary lifestyle     Objective:     Vitals: BP 118/76   Pulse 77   Temp 98 F (36.7 C) (Temporal)   Ht 5\' 5"  (1.651 m)   Wt 172 lb (78 kg)   SpO2 97%   BMI 28.62 kg/m   Body mass index is 28.62 kg/m.  Advanced Directives 05/18/2019 11/19/2018 11/03/2018 11/03/2018 10/06/2018  Does Patient Have a Medical Advance Directive? No No No No No  Does patient want to make changes to medical advance directive? No - Patient declined - - - -  Would patient like information on creating a medical advance directive? - No - Patient declined Yes (MAU/Ambulatory/Procedural Areas - Information given) - -    Tobacco Social History   Tobacco Use  Smoking Status Never Smoker  Smokeless Tobacco Never Used     Counseling given: Not Answered   Clinical Intake:  Pre-visit preparation completed: Yes  Pain : No/denies pain     BMI - recorded: 28.62 Nutritional Status: BMI 25 -29 Overweight Nutritional Risks: Unintentional weight gain Diabetes: No  How often do you need to have someone help you when you read instructions, pamphlets, or other written materials from your doctor or pharmacy?: 1 - Never What is the last grade level you completed in school?: masters degree  Interpreter Needed?: No     Past Medical History:  Diagnosis Date  . Hyperlipidemia   . Osteoporosis   . Seasonal allergies    Past Surgical History:  Procedure Laterality Date  . hysterectomy     Dr. Bartolo Darter  . WISDOM TOOTH EXTRACTION  1974   Family History  Problem Relation Age of Onset  . Dementia Mother 77  . Osteoporosis Mother   . Heart failure Father   . Atrial fibrillation Father   . Sick sinus syndrome Father   . Heart disease Father   . Diabetes Maternal Grandmother    . Arthritis Maternal Grandfather   . Dementia Maternal Grandfather   . Cancer Paternal Grandmother   . Heart attack Paternal Uncle   . Stroke Paternal Aunt    Social History   Socioeconomic History  . Marital status: Married    Spouse name: Not on file  . Number of children: Not on file  . Years of education: Not on file  . Highest education level: Not on file  Occupational History  . Not on file  Social Needs  . Financial resource strain: Not on file  . Food insecurity    Worry: Not on file    Inability: Not on file  . Transportation needs    Medical: Not on file    Non-medical: Not on file  Tobacco Use  . Smoking status: Never Smoker  . Smokeless tobacco: Never Used  Substance and Sexual Activity  . Alcohol use: Yes    Comment: generally 1 drink per month  . Drug use: Never  . Sexual activity: Not Currently  Lifestyle  . Physical activity    Days per week: Not on file    Minutes per session: Not on file  . Stress: Not on file  Relationships  . Social Herbalist on phone: Not on file    Gets together: Not on file    Attends religious service: Not on  file    Active member of club or organization: Not on file    Attends meetings of clubs or organizations: Not on file    Relationship status: Not on file  Other Topics Concern  . Not on file  Social History Narrative   Social History      Diet? Low carb/sugar      Do you drink/eat things with caffeine? Iced tea, chocolate      Marital status?   married          What year were you married? 1977      Do you live in a house, apartment, assisted living, condo, trailer, etc.? house      Is it one or more stories? 2      How many persons live in your home? 2      Do you have any pets in your home? (please list) 2 dogs      Highest level of education completed? Masters degree      Current or past profession: Museum/gallery curator, Microbiologist      Do you exercise?         Very little                              Type & how often? Walk, some weights      Advanced Directives      Do you have a living will? no      Do you have a DNR form?    no                              If not, do you want to discuss one? No, not now      Do you have signed POA/HPOA for forms? no      Functional Status      Do you have difficulty bathing or dressing yourself? no      Do you have difficulty preparing food or eating? no      Do you have difficulty managing your medications? no      Do you have difficulty managing your finances? no      Do you have difficulty affording your medications? no        Outpatient Encounter Medications as of 11/09/2019  Medication Sig  . alendronate (FOSAMAX) 70 MG tablet TAKE 1 TABLET BY MOUTH ONCE WEEKLY  . atorvastatin (LIPITOR) 20 MG tablet Take 1 tablet (20 mg total) by mouth daily.  . Calcium-Phosphorus-Vitamin D (CITRACAL +D3 PO) Take 1 tablet by mouth daily.  . Cholecalciferol (VITAMIN D3) 2000 units TABS Take 1 tablet by mouth daily.  . fluticasone (FLONASE) 50 MCG/ACT nasal spray Place 2 sprays into both nostrils daily as needed. As needed  . GLUCOSAMINE-CHONDROITIN PO Take 1 tablet by mouth daily.  Marland Kitchen loratadine (CLARITIN) 10 MG tablet Take 10 mg by mouth as needed.   . Omega-3 Fatty Acids (FISH OIL PO) Take by mouth daily.  . solifenacin (VESICARE) 10 MG tablet Take 10 mg by mouth.  . tizanidine (ZANAFLEX) 2 MG capsule Take 1 capsule (2 mg total) by mouth 3 (three) times daily as needed for muscle spasms.  Marland Kitchen UNABLE TO FIND Take 1 Dose by mouth daily. Nutrifil Optimal - V: heart, eyes, skin and lungs  . UNABLE TO FIND Take 1 Dose by mouth daily. Nutrifil Optimal- M: bones, nerves, and  muscles   No facility-administered encounter medications on file as of 11/09/2019.     Activities of Daily Living In your present state of health, do you have any difficulty performing the following activities: 11/09/2019  Hearing? N  Vision? N  Difficulty  concentrating or making decisions? N  Walking or climbing stairs? N  Dressing or bathing? N  Doing errands, shopping? N  Preparing Food and eating ? N  Using the Toilet? N  In the past six months, have you accidently leaked urine? Y  Do you have problems with loss of bowel control? N  Managing your Medications? N  Managing your Finances? N  Housekeeping or managing your Housekeeping? N  Some recent data might be hidden    Patient Care Team: Lauree Chandler, NP as PCP - General (Geriatric Medicine) Hale Bogus., MD as Referring Physician (Gastroenterology) Marylynn Pearson, MD as Consulting Physician (Obstetrics and Gynecology) Bjorn Loser, MD as Consulting Physician (Urology) Jacelyn Pi, MD as Consulting Physician (Endocrinology)    Assessment:   This is a routine wellness examination for Kaeden.  Exercise Activities and Dietary recommendations Current Exercise Habits: Home exercise routine, Type of exercise: walking, Time (Minutes): 45, Frequency (Times/Week): 4, Weekly Exercise (Minutes/Week): 180  Goals    . Increase physical activity     Would like to do more lifestyle modification with exercise- more weight bearing activity.     . Weight (lb) < 150 lb (68 kg) (pt-stated)     Would like to lose 15 lbs        Fall Risk Fall Risk  11/09/2019 05/18/2019 11/06/2018 11/03/2018 10/06/2018  Falls in the past year? 0 0 0 0 No  Number falls in past yr: 0 0 - - -  Injury with Fall? 0 0 - - -   Is the patient's home free of loose throw rugs in walkways, pet beds, electrical cords, etc?   no      Grab bars in the bathroom? no      Handrails on the stairs?   yes      Adequate lighting?   yes  Timed Get Up and Go performed: na  Depression Screen PHQ 2/9 Scores 11/09/2019 05/18/2019 10/06/2018  PHQ - 2 Score 0 0 0     Cognitive Function MMSE - Mini Mental State Exam 11/09/2019 11/03/2018  Orientation to time 5 5  Orientation to Place 5 5  Registration 3 3   Attention/ Calculation 5 5  Recall 3 3  Language- name 2 objects 2 2  Language- repeat 1 1  Language- follow 3 step command 3 3  Language- read & follow direction 1 1  Write a sentence 1 1  Copy design 1 1  Total score 30 30        Immunization History  Administered Date(s) Administered  . Influenza, High Dose Seasonal PF 10/06/2018, 10/01/2019  . Influenza-Unspecified 10/29/2014, 12/17/2016  . Pneumococcal Conjugate-13 11/03/2018  . Tdap 10/29/2014  . Zoster Recombinat (Shingrix) 10/10/2018, 12/09/2018    Qualifies for Shingles Vaccine?yes, up to date  Screening Tests Health Maintenance  Topic Date Due  . Hepatitis C Screening  [birth year]  . INFLUENZA VACCINE  08/01/2019  . PNA vac Low Risk Adult (2 of 2 - PPSV23) 11/04/2019  . MAMMOGRAM  05/13/2020  . TETANUS/TDAP  10/29/2024  . COLONOSCOPY  10/08/2028  . DEXA SCAN  Completed    Cancer Screenings: Lung: Low Dose CT Chest recommended if Age 62-80 years, 30 pack-year currently smoking  OR have quit w/in 15years. Patient does not qualify. Breast:  Up to date on Mammogram? Yes   Up to date of Bone Density/Dexa? Yes Colorectal: up to date  Additional Screenings:  Hepatitis C Screening: not done, agreeable to screening.      Plan:      I have personally reviewed and noted the following in the patient's chart:   . Medical and social history . Use of alcohol, tobacco or illicit drugs  . Current medications and supplements . Functional ability and status . Nutritional status . Physical activity . Advanced directives . List of other physicians . Hospitalizations, surgeries, and ER visits in previous 12 months . Vitals . Screenings to include cognitive, depression, and falls . Referrals and appointments  In addition, I have reviewed and discussed with patient certain preventive protocols, quality metrics, and best practice recommendations. A written personalized care plan for preventive services as well as  general preventive health recommendations were provided to patient.     Lauree Chandler, NP  11/09/2019

## 2019-11-11 LAB — CBC WITH DIFFERENTIAL/PLATELET
Absolute Monocytes: 264 cells/uL (ref 200–950)
Basophils Absolute: 9 cells/uL (ref 0–200)
Basophils Relative: 0.3 %
Eosinophils Absolute: 70 cells/uL (ref 15–500)
Eosinophils Relative: 2.4 %
HCT: 40.8 % (ref 35.0–45.0)
Hemoglobin: 13.8 g/dL (ref 11.7–15.5)
Lymphs Abs: 911 cells/uL (ref 850–3900)
MCH: 30.9 pg (ref 27.0–33.0)
MCHC: 33.8 g/dL (ref 32.0–36.0)
MCV: 91.3 fL (ref 80.0–100.0)
MPV: 9.1 fL (ref 7.5–12.5)
Monocytes Relative: 9.1 %
Neutro Abs: 1647 cells/uL (ref 1500–7800)
Neutrophils Relative %: 56.8 %
Platelets: 180 10*3/uL (ref 140–400)
RBC: 4.47 10*6/uL (ref 3.80–5.10)
RDW: 11.8 % (ref 11.0–15.0)
Total Lymphocyte: 31.4 %
WBC: 2.9 10*3/uL — ABNORMAL LOW (ref 3.8–10.8)

## 2019-11-11 LAB — LIPID PANEL
Cholesterol: 181 mg/dL (ref <200)
HDL: 86 mg/dL (ref >=50)
LDL Cholesterol (Calc): 79 mg/dL (calc)
Non-HDL Cholesterol (Calc): 95 mg/dL (calc) (ref <130)
Total CHOL/HDL Ratio: 2.1 (calc) (ref <5.0)
Triglycerides: 79 mg/dL (ref <150)

## 2019-11-11 LAB — COMPLETE METABOLIC PANEL WITH GFR
AG Ratio: 2 (calc) (ref 1.0–2.5)
ALT: 18 U/L (ref 6–29)
AST: 19 U/L (ref 10–35)
Albumin: 4.4 g/dL (ref 3.6–5.1)
Alkaline phosphatase (APISO): 43 U/L (ref 37–153)
BUN: 18 mg/dL (ref 7–25)
CO2: 31 mmol/L (ref 20–32)
Calcium: 9.6 mg/dL (ref 8.6–10.4)
Chloride: 104 mmol/L (ref 98–110)
Creat: 0.8 mg/dL (ref 0.50–0.99)
GFR, Est African American: 89 mL/min/{1.73_m2} (ref >=60)
GFR, Est Non African American: 77 mL/min/{1.73_m2} (ref >=60)
Globulin: 2.2 g/dL (calc) (ref 1.9–3.7)
Glucose, Bld: 96 mg/dL (ref 65–99)
Potassium: 4.1 mmol/L (ref 3.5–5.3)
Sodium: 141 mmol/L (ref 135–146)
Total Bilirubin: 0.5 mg/dL (ref 0.2–1.2)
Total Protein: 6.6 g/dL (ref 6.1–8.1)

## 2019-11-11 LAB — TEST AUTHORIZATION

## 2019-11-11 LAB — HEPATITIS C ANTIBODY
Hepatitis C Ab: NONREACTIVE
SIGNAL TO CUT-OFF: 0.02 (ref <1.00)

## 2019-11-11 LAB — PATHOLOGIST SMEAR REVIEW

## 2019-11-18 ENCOUNTER — Ambulatory Visit (INDEPENDENT_AMBULATORY_CARE_PROVIDER_SITE_OTHER): Payer: Medicare Other | Admitting: Nurse Practitioner

## 2019-11-18 ENCOUNTER — Other Ambulatory Visit: Payer: Self-pay

## 2019-11-18 ENCOUNTER — Encounter: Payer: Self-pay | Admitting: Nurse Practitioner

## 2019-11-18 VITALS — BP 130/80 | HR 74 | Temp 97.5°F | Resp 20 | Ht 65.0 in | Wt 174.6 lb

## 2019-11-18 DIAGNOSIS — M810 Age-related osteoporosis without current pathological fracture: Secondary | ICD-10-CM

## 2019-11-18 DIAGNOSIS — J302 Other seasonal allergic rhinitis: Secondary | ICD-10-CM | POA: Diagnosis not present

## 2019-11-18 DIAGNOSIS — E782 Mixed hyperlipidemia: Secondary | ICD-10-CM

## 2019-11-18 DIAGNOSIS — N3281 Overactive bladder: Secondary | ICD-10-CM | POA: Diagnosis not present

## 2019-11-18 NOTE — Progress Notes (Signed)
Careteam: Patient Care Team: Lauree Chandler, NP as PCP - General (Geriatric Medicine) Hale Bogus., MD as Referring Physician (Gastroenterology) Marylynn Pearson, MD as Consulting Physician (Obstetrics and Gynecology) Bjorn Loser, MD as Consulting Physician (Urology) Jacelyn Pi, MD as Consulting Physician (Endocrinology)  Advanced Directive information Does Patient Have a Medical Advance Directive?: Yes, Type of Advance Directive: Living will(will bring in copy), Does patient want to make changes to medical advance directive?: No - Patient declined  Allergies  Allergen Reactions  . Fish Allergy     Any fish in the flounder family causes rash, difficulty breathing, swelling of eyes, itching, water/swelling under eyes  . Penicillins     Swelling of the lips and a rash  . Tape     Localized rash    Chief Complaint  Patient presents with  . Medical Management of Chronic Issues    6 Month Follow Up     HPI: Patient is a 68 y.o. female seen in the office today for routine follow up.   Osteoporosis- continues on fosamax prescribed by GYN  Hyperlipidemia- controlled.  On lipitor   Low wbc- no signs of infection. Dry cough contributes to allergies.   Seasonal allergies- went back to taking claritin 10 mg by mouth which has helped.   OAB- continues on vesicare - 90% successful. Followed with urologist.     Review of Systems:  Review of Systems  Constitutional: Negative for chills, fever and weight loss.  HENT: Negative for tinnitus.   Respiratory: Negative for cough, sputum production and shortness of breath.   Cardiovascular: Negative for chest pain, palpitations and leg swelling.  Gastrointestinal: Negative for abdominal pain, constipation, diarrhea and heartburn.  Genitourinary: Negative for dysuria, frequency and urgency.       Overactive bladder  Musculoskeletal: Negative for back pain, falls, joint pain and myalgias.  Skin: Negative.    Neurological: Negative for dizziness and headaches.  Psychiatric/Behavioral: Negative for depression and memory loss. The patient does not have insomnia.     Past Medical History:  Diagnosis Date  . Hyperlipidemia   . Osteoporosis   . Seasonal allergies    Past Surgical History:  Procedure Laterality Date  . hysterectomy     Dr. Bartolo Darter  . WISDOM TOOTH EXTRACTION  1974   Social History:   reports that she has never smoked. She has never used smokeless tobacco. She reports current alcohol use. She reports that she does not use drugs.  Family History  Problem Relation Age of Onset  . Dementia Mother 60  . Osteoporosis Mother   . Heart failure Father   . Atrial fibrillation Father   . Sick sinus syndrome Father   . Heart disease Father   . Diabetes Maternal Grandmother   . Arthritis Maternal Grandfather   . Dementia Maternal Grandfather   . Cancer Paternal Grandmother   . Heart attack Paternal Uncle   . Stroke Paternal Aunt     Medications: Patient's Medications  New Prescriptions   No medications on file  Previous Medications   ALENDRONATE (FOSAMAX) 70 MG TABLET    TAKE 1 TABLET BY MOUTH ONCE WEEKLY   ATORVASTATIN (LIPITOR) 20 MG TABLET    Take 1 tablet (20 mg total) by mouth daily.   CALCIUM-PHOSPHORUS-VITAMIN D (CITRACAL +D3 PO)    Take 1 tablet by mouth daily.   CHOLECALCIFEROL (VITAMIN D3) 2000 UNITS TABS    Take 1 tablet by mouth daily.   FLUTICASONE (FLONASE) 50 MCG/ACT  NASAL SPRAY    Place 2 sprays into both nostrils daily as needed. As needed   GLUCOSAMINE-CHONDROITIN PO    Take 1 tablet by mouth daily.   LORATADINE (CLARITIN) 10 MG TABLET    Take 10 mg by mouth as needed.    OMEGA-3 FATTY ACIDS (FISH OIL PO)    Take by mouth daily.   SOLIFENACIN (VESICARE) 10 MG TABLET    Take 10 mg by mouth.   TIZANIDINE (ZANAFLEX) 2 MG CAPSULE    Take 1 capsule (2 mg total) by mouth 3 (three) times daily as needed for muscle spasms.   UNABLE TO FIND    Take 1 Dose  by mouth daily. Nutrifil Optimal - V: heart, eyes, skin and lungs   UNABLE TO FIND    Take 1 Dose by mouth daily. Nutrifil Optimal- M: bones, nerves, and muscles   VITAMIN E PO    Take by mouth. Take 1 by mouth daily  Modified Medications   No medications on file  Discontinued Medications   No medications on file    Physical Exam:  Vitals:   11/18/19 1040 11/18/19 1138  BP: 140/90 130/80  Pulse: 74   Resp: 20   Temp: (!) 97.5 F (36.4 C)   TempSrc: Oral   SpO2: 97%   Weight: 174 lb 9.6 oz (79.2 kg)   Height: 5\' 5"  (1.651 m)    Body mass index is 29.05 kg/m. Wt Readings from Last 3 Encounters:  11/18/19 174 lb 9.6 oz (79.2 kg)  11/09/19 172 lb (78 kg)  05/18/19 170 lb 3.2 oz (77.2 kg)    Physical Exam Constitutional:      General: She is not in acute distress.    Appearance: She is well-developed. She is not diaphoretic.  HENT:     Head: Normocephalic and atraumatic.     Mouth/Throat:     Pharynx: No oropharyngeal exudate.  Eyes:     Conjunctiva/sclera: Conjunctivae normal.     Pupils: Pupils are equal, round, and reactive to light.  Neck:     Musculoskeletal: Normal range of motion and neck supple.  Cardiovascular:     Rate and Rhythm: Normal rate and regular rhythm.     Heart sounds: Normal heart sounds.  Pulmonary:     Effort: Pulmonary effort is normal.     Breath sounds: Normal breath sounds.  Abdominal:     General: Bowel sounds are normal.     Palpations: Abdomen is soft.  Musculoskeletal:        General: No tenderness.  Skin:    General: Skin is warm and dry.  Neurological:     Mental Status: She is alert and oriented to person, place, and time.     Labs reviewed: Basic Metabolic Panel: Recent Labs    05/18/19 1114 11/09/19 1130  NA 142 141  K 4.3 4.1  CL 106 104  CO2 32 31  GLUCOSE 94 96  BUN 17 18  CREATININE 0.82 0.80  CALCIUM 9.4 9.6   Liver Function Tests: Recent Labs    05/18/19 1114 11/09/19 1130  AST 23 19  ALT 22 18   BILITOT 0.5 0.5  PROT 6.7 6.6   No results for input(s): LIPASE, AMYLASE in the last 8760 hours. No results for input(s): AMMONIA in the last 8760 hours. CBC: Recent Labs    05/18/19 1114 11/09/19 1130  WBC 3.5* 2.9*  NEUTROABS 2,142 1,647  HGB 13.7 13.8  HCT 40.1 40.8  MCV 91.1  91.3  PLT 188 180   Lipid Panel: Recent Labs    05/18/19 1114 11/09/19 1130  CHOL 188 181  HDL 84 86  LDLCALC 90 79  TRIG 55 79  CHOLHDL 2.2 2.1   TSH: No results for input(s): TSH in the last 8760 hours. A1C: Lab Results  Component Value Date   HGBA1C 5.2 05/13/2018     Assessment/Plan 1. Age-related osteoporosis without current pathological fracture -managed by GYN, continues on fosamax with cal and vit d, encouraged weight bearing activity  2. Mixed hyperlipidemia LDL at goal on lipitor 20 mg daily. To continue medication and dietary modifications.   3. Overactive bladder -stable on vesicare, being managed by urologist. Will continue current medication   4. Seasonal allergies -worse around season change. Restarted Claritin which has improved symptoms.   Next appt: 6 months, sooner if needed Jillana Selph K. [place], [facility] [phone]

## 2019-11-18 NOTE — Patient Instructions (Addendum)
Limit sweets. Increase physical activity.  Keep hydrated.    DASH Eating Plan DASH stands for "Dietary Approaches to Stop Hypertension." The DASH eating plan is a healthy eating plan that has been shown to reduce high blood pressure (hypertension). It may also reduce your risk for type 2 diabetes, heart disease, and stroke. The DASH eating plan may also help with weight loss. What are tips for following this plan?  General guidelines  Avoid eating more than 2,300 mg (milligrams) of salt (sodium) a day. If you have hypertension, you may need to reduce your sodium intake to 1,500 mg a day.  Limit alcohol intake to no more than 1 drink a day for nonpregnant women and 2 drinks a day for men. One drink equals 12 oz of beer, 5 oz of wine, or 1 oz of hard liquor.  Work with your health care provider to maintain a healthy body weight or to lose weight. Ask what an ideal weight is for you.  Get at least 30 minutes of exercise that causes your heart to beat faster (aerobic exercise) most days of the week. Activities may include walking, swimming, or biking.  Work with your health care provider or diet and nutrition specialist (dietitian) to adjust your eating plan to your individual calorie needs. Reading food labels   Check food labels for the amount of sodium per serving. Choose foods with less than 5 percent of the Daily Value of sodium. Generally, foods with less than 300 mg of sodium per serving fit into this eating plan.  To find whole grains, look for the word "whole" as the first word in the ingredient list. Shopping  Buy products labeled as "low-sodium" or "no salt added."  Buy fresh foods. Avoid canned foods and premade or frozen meals. Cooking  Avoid adding salt when cooking. Use salt-free seasonings or herbs instead of table salt or sea salt. Check with your health care provider or pharmacist before using salt substitutes.  Do not fry foods. Cook foods using healthy methods such  as baking, boiling, grilling, and broiling instead.  Cook with heart-healthy oils, such as olive, canola, soybean, or sunflower oil. Meal planning  Eat a balanced diet that includes: ? 5 or more servings of fruits and vegetables each day. At each meal, try to fill half of your plate with fruits and vegetables. ? Up to 6-8 servings of whole grains each day. ? Less than 6 oz of lean meat, poultry, or fish each day. A 3-oz serving of meat is about the same size as a deck of cards. One egg equals 1 oz. ? 2 servings of low-fat dairy each day. ? A serving of nuts, seeds, or beans 5 times each week. ? Heart-healthy fats. Healthy fats called Omega-3 fatty acids are found in foods such as flaxseeds and coldwater fish, like sardines, salmon, and mackerel.  Limit how much you eat of the following: ? Canned or prepackaged foods. ? Food that is high in trans fat, such as fried foods. ? Food that is high in saturated fat, such as fatty meat. ? Sweets, desserts, sugary drinks, and other foods with added sugar. ? Full-fat dairy products.  Do not salt foods before eating.  Try to eat at least 2 vegetarian meals each week.  Eat more home-cooked food and less restaurant, buffet, and fast food.  When eating at a restaurant, ask that your food be prepared with less salt or no salt, if possible. What foods are recommended? The items listed  may not be a complete list. Talk with your dietitian about what dietary choices are best for you. Grains Whole-grain or whole-wheat bread. Whole-grain or whole-wheat pasta. Brown rice. Modena Morrow. Bulgur. Whole-grain and low-sodium cereals. Pita bread. Low-fat, low-sodium crackers. Whole-wheat flour tortillas. Vegetables Fresh or frozen vegetables (raw, steamed, roasted, or grilled). Low-sodium or reduced-sodium tomato and vegetable juice. Low-sodium or reduced-sodium tomato sauce and tomato paste. Low-sodium or reduced-sodium canned vegetables. Fruits All fresh,  dried, or frozen fruit. Canned fruit in natural juice (without added sugar). Meat and other protein foods Skinless chicken or [place]. Ground chicken or [place]. Pork with fat trimmed off. Fish and seafood. Egg whites. Dried beans, peas, or lentils. Unsalted nuts, nut butters, and seeds. Unsalted canned beans. Lean cuts of beef with fat trimmed off. Low-sodium, lean deli meat. Dairy Low-fat (1%) or fat-free (skim) milk. Fat-free, low-fat, or reduced-fat cheeses. Nonfat, low-sodium ricotta or cottage cheese. Low-fat or nonfat yogurt. Low-fat, low-sodium cheese. Fats and oils Soft margarine without trans fats. Vegetable oil. Low-fat, reduced-fat, or light mayonnaise and salad dressings (reduced-sodium). Canola, safflower, olive, soybean, and sunflower oils. Avocado. Seasoning and other foods Herbs. Spices. Seasoning mixes without salt. Unsalted popcorn and pretzels. Fat-free sweets. What foods are not recommended? The items listed may not be a complete list. Talk with your dietitian about what dietary choices are best for you. Grains Baked goods made with fat, such as croissants, muffins, or some breads. Dry pasta or rice meal packs. Vegetables Creamed or fried vegetables. Vegetables in a cheese sauce. Regular canned vegetables (not low-sodium or reduced-sodium). Regular canned tomato sauce and paste (not low-sodium or reduced-sodium). Regular tomato and vegetable juice (not low-sodium or reduced-sodium). Angie Fava. Olives. Fruits Canned fruit in a light or heavy syrup. Fried fruit. Fruit in cream or butter sauce. Meat and other protein foods Fatty cuts of meat. Ribs. Fried meat. Berniece Salines. Sausage. Bologna and other processed lunch meats. Salami. Fatback. Hotdogs. Bratwurst. Salted nuts and seeds. Canned beans with added salt. Canned or smoked fish. Whole eggs or egg yolks. Chicken or [place] with skin. Dairy Whole or 2% milk, cream, and half-and-half. Whole or full-fat cream cheese. Whole-fat or sweetened  yogurt. Full-fat cheese. Nondairy creamers. Whipped toppings. Processed cheese and cheese spreads. Fats and oils Butter. Stick margarine. Lard. Shortening. Ghee. Bacon fat. Tropical oils, such as coconut, palm kernel, or palm oil. Seasoning and other foods Salted popcorn and pretzels. Onion salt, garlic salt, seasoned salt, table salt, and sea salt. Worcestershire sauce. Tartar sauce. Barbecue sauce. Teriyaki sauce. Soy sauce, including reduced-sodium. Steak sauce. Canned and packaged gravies. Fish sauce. Oyster sauce. Cocktail sauce. Horseradish that you find on the shelf. Ketchup. Mustard. Meat flavorings and tenderizers. Bouillon cubes. Hot sauce and Tabasco sauce. Premade or packaged marinades. Premade or packaged taco seasonings. Relishes. Regular salad dressings. Where to find more information:  National Heart, Lung, and [place]: https://wilson-eaton.com/  American Heart Association: www.heart.org Summary  The DASH eating plan is a healthy eating plan that has been shown to reduce high blood pressure (hypertension). It may also reduce your risk for type 2 diabetes, heart disease, and stroke.  With the DASH eating plan, you should limit salt (sodium) intake to 2,300 mg a day. If you have hypertension, you may need to reduce your sodium intake to 1,500 mg a day.  When on the DASH eating plan, aim to eat more fresh fruits and vegetables, whole grains, lean proteins, low-fat dairy, and heart-healthy fats.  Work with your health care provider or diet  and nutrition specialist (dietitian) to adjust your eating plan to your individual calorie needs. This information is not intended to replace advice given to you by your health care provider. Make sure you discuss any questions you have with your health care provider. Document Released: 12/06/2011 Document Revised: 11/29/2017 Document Reviewed: 12/10/2016 Elsevier Patient Education  2020 [employer].

## 2019-11-30 ENCOUNTER — Other Ambulatory Visit: Payer: Self-pay

## 2019-11-30 DIAGNOSIS — Z20822 Contact with and (suspected) exposure to covid-19: Secondary | ICD-10-CM

## 2019-11-30 DIAGNOSIS — Z20828 Contact with and (suspected) exposure to other viral communicable diseases: Secondary | ICD-10-CM | POA: Diagnosis not present

## 2019-12-01 LAB — NOVEL CORONAVIRUS, NAA: SARS-CoV-2, NAA: NOT DETECTED

## 2019-12-05 ENCOUNTER — Other Ambulatory Visit: Payer: Self-pay | Admitting: Nurse Practitioner

## 2019-12-07 ENCOUNTER — Other Ambulatory Visit: Payer: Self-pay

## 2019-12-07 DIAGNOSIS — Z20828 Contact with and (suspected) exposure to other viral communicable diseases: Secondary | ICD-10-CM | POA: Diagnosis not present

## 2019-12-07 DIAGNOSIS — Z20822 Contact with and (suspected) exposure to covid-19: Secondary | ICD-10-CM

## 2019-12-09 LAB — NOVEL CORONAVIRUS, NAA: SARS-CoV-2, NAA: NOT DETECTED

## 2019-12-18 ENCOUNTER — Ambulatory Visit: Payer: Medicare Other | Attending: Internal Medicine

## 2019-12-18 DIAGNOSIS — Z20822 Contact with and (suspected) exposure to covid-19: Secondary | ICD-10-CM

## 2019-12-19 LAB — NOVEL CORONAVIRUS, NAA: SARS-CoV-2, NAA: NOT DETECTED

## 2020-01-25 ENCOUNTER — Other Ambulatory Visit: Payer: Self-pay | Admitting: *Deleted

## 2020-01-25 MED ORDER — ATORVASTATIN CALCIUM 20 MG PO TABS: 20.0000 mg | ORAL_TABLET | Freq: Every day | ORAL | 1 refills | Status: DC

## 2020-01-25 NOTE — Telephone Encounter (Signed)
Patient requested  Wants sent to [place].

## 2020-01-27 ENCOUNTER — Ambulatory Visit: Payer: Medicare Other

## 2020-02-03 ENCOUNTER — Other Ambulatory Visit: Payer: Self-pay | Admitting: *Deleted

## 2020-02-03 MED ORDER — ATORVASTATIN CALCIUM 20 MG PO TABS: 20.0000 mg | ORAL_TABLET | Freq: Every day | ORAL | 1 refills | Status: DC

## 2020-02-03 NOTE — Telephone Encounter (Signed)
[facility] sent refill Request. Did not receive previous Request. Refaxed.

## 2020-05-18 ENCOUNTER — Ambulatory Visit: Payer: Medicare Other | Admitting: Nurse Practitioner

## 2020-06-08 ENCOUNTER — Other Ambulatory Visit: Payer: Self-pay

## 2020-06-08 ENCOUNTER — Encounter: Payer: Self-pay | Admitting: Nurse Practitioner

## 2020-06-08 ENCOUNTER — Ambulatory Visit (INDEPENDENT_AMBULATORY_CARE_PROVIDER_SITE_OTHER): Payer: Medicare Other | Admitting: Nurse Practitioner

## 2020-06-08 VITALS — BP 110/80 | HR 79 | Temp 97.1°F | Ht 65.0 in | Wt 165.8 lb

## 2020-06-08 DIAGNOSIS — D72819 Decreased white blood cell count, unspecified: Secondary | ICD-10-CM | POA: Diagnosis not present

## 2020-06-08 DIAGNOSIS — M810 Age-related osteoporosis without current pathological fracture: Secondary | ICD-10-CM | POA: Diagnosis not present

## 2020-06-08 DIAGNOSIS — N3281 Overactive bladder: Secondary | ICD-10-CM

## 2020-06-08 DIAGNOSIS — E782 Mixed hyperlipidemia: Secondary | ICD-10-CM

## 2020-06-08 DIAGNOSIS — J302 Other seasonal allergic rhinitis: Secondary | ICD-10-CM

## 2020-06-08 LAB — COMPLETE METABOLIC PANEL WITH GFR
AG Ratio: 1.9 (calc) (ref 1.0–2.5)
ALT: 18 U/L (ref 6–29)
AST: 20 U/L (ref 10–35)
Albumin: 4.5 g/dL (ref 3.6–5.1)
Alkaline phosphatase (APISO): 50 U/L (ref 37–153)
BUN/Creatinine Ratio: 19 (calc) (ref 6–22)
BUN: 19 mg/dL (ref 7–25)
CO2: 31 mmol/L (ref 20–32)
Calcium: 9.6 mg/dL (ref 8.6–10.4)
Chloride: 102 mmol/L (ref 98–110)
Creat: 1.02 mg/dL — ABNORMAL HIGH (ref 0.50–0.99)
GFR, Est African American: 66 mL/min/{1.73_m2} (ref >=60)
GFR, Est Non African American: 57 mL/min/{1.73_m2} — ABNORMAL LOW (ref >=60)
Globulin: 2.4 g/dL (calc) (ref 1.9–3.7)
Glucose, Bld: 93 mg/dL (ref 65–99)
Potassium: 4.2 mmol/L (ref 3.5–5.3)
Sodium: 140 mmol/L (ref 135–146)
Total Bilirubin: 0.5 mg/dL (ref 0.2–1.2)
Total Protein: 6.9 g/dL (ref 6.1–8.1)

## 2020-06-08 LAB — LIPID PANEL
Cholesterol: 168 mg/dL (ref <200)
HDL: 78 mg/dL (ref >=50)
LDL Cholesterol (Calc): 74 mg/dL (calc)
Non-HDL Cholesterol (Calc): 90 mg/dL (calc) (ref <130)
Total CHOL/HDL Ratio: 2.2 (calc) (ref <5.0)
Triglycerides: 76 mg/dL (ref <150)

## 2020-06-08 LAB — CBC WITH DIFFERENTIAL/PLATELET
Absolute Monocytes: 307 cells/uL (ref 200–950)
Basophils Absolute: 20 cells/uL (ref 0–200)
Basophils Relative: 0.6 %
Eosinophils Absolute: 79 cells/uL (ref 15–500)
Eosinophils Relative: 2.4 %
HCT: 41.6 % (ref 35.0–45.0)
Hemoglobin: 14.2 g/dL (ref 11.7–15.5)
Lymphs Abs: 980 cells/uL (ref 850–3900)
MCH: 31.3 pg (ref 27.0–33.0)
MCHC: 34.1 g/dL (ref 32.0–36.0)
MCV: 91.6 fL (ref 80.0–100.0)
MPV: 8.7 fL (ref 7.5–12.5)
Monocytes Relative: 9.3 %
Neutro Abs: 1914 cells/uL (ref 1500–7800)
Neutrophils Relative %: 58 %
Platelets: 205 10*3/uL (ref 140–400)
RBC: 4.54 10*6/uL (ref 3.80–5.10)
RDW: 12.2 % (ref 11.0–15.0)
Total Lymphocyte: 29.7 %
WBC: 3.3 10*3/uL — ABNORMAL LOW (ref 3.8–10.8)

## 2020-06-08 NOTE — Patient Instructions (Signed)
Continue to work on dietary modifications and attempt to increase exercise  30 mins/ 5 days a week recommended   Health Maintenance, Female Adopting a healthy lifestyle and getting preventive care are important in promoting health and wellness. Ask your health care provider about:  The right schedule for you to have regular tests and exams.  Things you can do on your own to prevent diseases and keep yourself healthy. What should I know about diet, weight, and exercise? Eat a healthy diet   Eat a diet that includes plenty of vegetables, fruits, low-fat dairy products, and lean protein.  Do not eat a lot of foods that are high in solid fats, added sugars, or sodium. Maintain a healthy weight Body mass index (BMI) is used to identify weight problems. It estimates body fat based on height and weight. Your health care provider can help determine your BMI and help you achieve or maintain a healthy weight. Get regular exercise Get regular exercise. This is one of the most important things you can do for your health. Most adults should:  Exercise for at least 150 minutes each week. The exercise should increase your heart rate and make you sweat (moderate-intensity exercise).  Do strengthening exercises at least twice a week. This is in addition to the moderate-intensity exercise.  Spend less time sitting. Even light physical activity can be beneficial. Watch cholesterol and blood lipids Have your blood tested for lipids and cholesterol at 67 years of age, then have this test every 5 years. Have your cholesterol levels checked more often if:  Your lipid or cholesterol levels are high.  You are older than 67 years of age.  You are at high risk for heart disease. What should I know about cancer screening? Depending on your health history and family history, you may need to have cancer screening at various ages. This may include screening for:  Breast cancer.  Cervical  cancer.  Colorectal cancer.  Skin cancer.  Lung cancer. What should I know about heart disease, diabetes, and high blood pressure? Blood pressure and heart disease  High blood pressure causes heart disease and increases the risk of stroke. This is more likely to develop in people who have high blood pressure readings, are of African descent, or are overweight.  Have your blood pressure checked: ? Every 3-5 years if you are 35-82 years of age. ? Every year if you are 43 years old or older. Diabetes Have regular diabetes screenings. This checks your fasting blood sugar level. Have the screening done:  Once every three years after age 75 if you are at a normal weight and have a low risk for diabetes.  More often and at a younger age if you are overweight or have a high risk for diabetes. What should I know about preventing infection? Hepatitis B If you have a higher risk for hepatitis B, you should be screened for this virus. Talk with your health care provider to find out if you are at risk for hepatitis B infection. Hepatitis C Testing is recommended for:  Everyone born from [birth year] through 1963.  Anyone with known risk factors for hepatitis C. Sexually transmitted infections (STIs)  Get screened for STIs, including gonorrhea and chlamydia, if: ? You are sexually active and are younger than 67 years of age. ? You are older than 67 years of age and your health care provider tells you that you are at risk for this type of infection. ? Your sexual activity has changed  since you were last screened, and you are at increased risk for chlamydia or gonorrhea. Ask your health care provider if you are at risk.  Ask your health care provider about whether you are at high risk for HIV. Your health care provider may recommend a prescription medicine to help prevent HIV infection. If you choose to take medicine to prevent HIV, you should first get tested for HIV. You should then be tested every 3  months for as long as you are taking the medicine. Pregnancy  If you are about to stop having your period (premenopausal) and you may become pregnant, seek counseling before you get pregnant.  Take 400 to 800 micrograms (mcg) of folic acid every day if you become pregnant.  Ask for birth control (contraception) if you want to prevent pregnancy. Osteoporosis and menopause Osteoporosis is a disease in which the bones lose minerals and strength with aging. This can result in bone fractures. If you are 77 years old or older, or if you are at risk for osteoporosis and fractures, ask your health care provider if you should:  Be screened for bone loss.  Take a calcium or vitamin D supplement to lower your risk of fractures.  Be given hormone replacement therapy (HRT) to treat symptoms of menopause. Follow these instructions at home: Lifestyle  Do not use any products that contain nicotine or tobacco, such as cigarettes, e-cigarettes, and chewing tobacco. If you need help quitting, ask your health care provider.  Do not use street drugs.  Do not share needles.  Ask your health care provider for help if you need support or information about quitting drugs. Alcohol use  Do not drink alcohol if: ? Your health care provider tells you not to drink. ? You are pregnant, may be pregnant, or are planning to become pregnant.  If you drink alcohol: ? Limit how much you use to 0-1 drink a day. ? Limit intake if you are breastfeeding.  Be aware of how much alcohol is in your drink. In the [place] one drink equals one 12 oz bottle of beer (355 mL), one 5 oz glass of wine (148 mL), or one 1 oz glass of hard liquor (44 mL). General instructions  Schedule regular health, dental, and eye exams.  Stay current with your vaccines.  Tell your health care provider if: ? You often feel depressed. ? You have ever been abused or do not feel safe at home. Summary  Adopting a healthy lifestyle and getting  preventive care are important in promoting health and wellness.  Follow your health care provider's instructions about healthy diet, exercising, and getting tested or screened for diseases.  Follow your health care provider's instructions on monitoring your cholesterol and blood pressure. This information is not intended to replace advice given to you by your health care provider. Make sure you discuss any questions you have with your health care provider. Document Revised: 12/10/2018 Document Reviewed: 12/10/2018 Elsevier Patient Education  2020 [employer].

## 2020-06-08 NOTE — Progress Notes (Signed)
Careteam: Patient Care Team: Lauree Chandler, NP as PCP - General (Geriatric Medicine) Hale Bogus., MD as Referring Physician (Gastroenterology) Marylynn Pearson, MD as Consulting Physician (Obstetrics and Gynecology) Bjorn Loser, MD as Consulting Physician (Urology) Jacelyn Pi, MD as Consulting Physician (Endocrinology)  PLACE OF SERVICE:  [facility] Directive information Does Patient Have a Medical Advance Directive?: Yes, Type of Advance Directive: Living will, Does patient want to make changes to medical advance directive?: No - Patient declined  Allergies  Allergen Reactions  . Fish Allergy     Any fish in the flounder family causes rash, difficulty breathing, swelling of eyes, itching, water/swelling under eyes  . Penicillins     Swelling of the lips and a rash  . Tape     Localized rash    Chief Complaint  Patient presents with  . Medical Management of Chronic Issues    6 month follow-up   . Quality Metric Gaps    Discuss need for mamogram   . Cough    Dry hacking cough off/on      HPI: Patient is a 69 y.o. female for routine follow up   routine follow up.  Was in [place] and [place] most of the last 6 months. Glad to be home.  Her father is in his 50s and lives in [place], she has been down visiting and helping with him.   Trying to lose weight by eating better- needs to get back into exercise.   appt in august for GYN and will get mamogram and bone density at that time.   Allergies-  scratchy throat with sore throat occasionally- cough- feels like its due to mouth breathing and mask. Went away in [place], went to [place] and started xyzal which improved symptoms.   Overactive bladder- followed by urologist, feels like vesicare is not working as well as it could. Has made dietary modifications.  Review of Systems:  Review of Systems  Constitutional: Negative for chills, fever and weight loss.  HENT: Negative for tinnitus.     Respiratory: Negative for cough, sputum production and shortness of breath.   Cardiovascular: Negative for chest pain, palpitations and leg swelling.  Gastrointestinal: Negative for abdominal pain, constipation, diarrhea and heartburn.  Genitourinary: Negative for dysuria, frequency and urgency.  Musculoskeletal: Negative for back pain, falls, joint pain and myalgias.  Skin: Negative.   Neurological: Negative for dizziness and headaches.  Endo/Heme/Allergies: Positive for environmental allergies.  Psychiatric/Behavioral: Negative for depression and memory loss. The patient does not have insomnia.     Past Medical History:  Diagnosis Date  . Hyperlipidemia   . Osteoporosis   . Seasonal allergies    Past Surgical History:  Procedure Laterality Date  . hysterectomy     Dr. Bartolo Darter  . WISDOM TOOTH EXTRACTION  1974   Social History:   reports that she has never smoked. She has never used smokeless tobacco. She reports current alcohol use. She reports that she does not use drugs.  Family History  Problem Relation Age of Onset  . Dementia Mother 22  . Osteoporosis Mother   . Heart failure Father   . Atrial fibrillation Father   . Sick sinus syndrome Father   . Heart disease Father   . Diabetes Maternal Grandmother   . Arthritis Maternal Grandfather   . Dementia Maternal Grandfather   . Cancer Paternal Grandmother   . Heart attack Paternal Uncle   . Stroke Paternal Aunt  Medications: Patient's Medications  New Prescriptions   No medications on file  Previous Medications   ALENDRONATE (FOSAMAX) 70 MG TABLET    TAKE 1 TABLET BY MOUTH ONCE WEEKLY   ATORVASTATIN (LIPITOR) 20 MG TABLET    Take 1 tablet (20 mg total) by mouth daily.   CALCIUM-PHOSPHORUS-VITAMIN D (CITRACAL +D3 PO)    Take 1 tablet by mouth daily.   CHOLECALCIFEROL (VITAMIN D3) 2000 UNITS TABS    Take 1 tablet by mouth daily.   FLUTICASONE (FLONASE) 50 MCG/ACT NASAL SPRAY    Place 2 sprays into both  nostrils daily as needed. As needed   GLUCOSAMINE-CHONDROITIN PO    Take 1 tablet by mouth daily.   LEVOCETIRIZINE (XYZAL) 5 MG TABLET    Take 5 mg by mouth every evening.   OMEGA-3 FATTY ACIDS (FISH OIL PO)    Take by mouth daily.   SOLIFENACIN (VESICARE) 10 MG TABLET    Take 10 mg by mouth.   TIZANIDINE (ZANAFLEX) 2 MG CAPSULE    Take 1 capsule (2 mg total) by mouth 3 (three) times daily as needed for muscle spasms.   UNABLE TO FIND    Take 1 Dose by mouth daily. Nutrifil Optimal - V: heart, eyes, skin and lungs   UNABLE TO FIND    Take 1 Dose by mouth daily. Nutrifil Optimal- M: bones, nerves, and muscles   VITAMIN E PO    Take by mouth. Take 1 by mouth daily  Modified Medications   No medications on file  Discontinued Medications   LORATADINE (CLARITIN) 10 MG TABLET    Take 10 mg by mouth as needed.     Physical Exam:  Vitals:   06/08/20 0935  BP: 110/80  Pulse: 79  Temp: (!) 97.1 F (36.2 C)  TempSrc: Temporal  SpO2: 98%  Weight: 165 lb 12.8 oz (75.2 kg)  Height: '5\' 5"'$  (1.651 m)   Body mass index is 27.59 kg/m. Wt Readings from Last 3 Encounters:  06/08/20 165 lb 12.8 oz (75.2 kg)  11/18/19 174 lb 9.6 oz (79.2 kg)  11/09/19 172 lb (78 kg)    Physical Exam Constitutional:      General: She is not in acute distress.    Appearance: She is well-developed. She is not diaphoretic.  HENT:     Head: Normocephalic and atraumatic.     Nose: Nose normal.     Mouth/Throat:     Mouth: Mucous membranes are moist.     Pharynx: No oropharyngeal exudate.  Eyes:     Conjunctiva/sclera: Conjunctivae normal.     Pupils: Pupils are equal, round, and reactive to light.  Cardiovascular:     Rate and Rhythm: Normal rate and regular rhythm.     Heart sounds: Normal heart sounds.  Pulmonary:     Effort: Pulmonary effort is normal.     Breath sounds: Normal breath sounds.  Abdominal:     General: Bowel sounds are normal.     Palpations: Abdomen is soft.  Musculoskeletal:         General: No tenderness.     Cervical back: Normal range of motion and neck supple.     Right lower leg: No edema.     Left lower leg: No edema.  Skin:    General: Skin is warm and dry.  Neurological:     Mental Status: She is alert and oriented to person, place, and time.  Psychiatric:        Mood and  Affect: Mood normal.        Behavior: Behavior normal.     Labs reviewed: Basic Metabolic Panel: Recent Labs    11/09/19 1130  NA 141  K 4.1  CL 104  CO2 31  GLUCOSE 96  BUN 18  CREATININE 0.80  CALCIUM 9.6   Liver Function Tests: Recent Labs    11/09/19 1130  AST 19  ALT 18  BILITOT 0.5  PROT 6.6   No results for input(s): LIPASE, AMYLASE in the last 8760 hours. No results for input(s): AMMONIA in the last 8760 hours. CBC: Recent Labs    11/09/19 1130  WBC 2.9*  NEUTROABS 1,647  HGB 13.8  HCT 40.8  MCV 91.3  PLT 180   Lipid Panel: Recent Labs    11/09/19 1130  CHOL 181  HDL 86  LDLCALC 79  TRIG 79  CHOLHDL 2.1   TSH: No results for input(s): TSH in the last 8760 hours. A1C: Lab Results  Component Value Date   HGBA1C 5.2 05/13/2018     Assessment/Plan 1. Overactive bladder -ongoing, using vesicare, following with urology and plans to address with them. Has made lifestyle modifications.   2. Mixed hyperlipidemia -continue dietary modifications with lipitor 20 mg daily  - Lipid Panel - CMP with eGFR([facility]) - Lipid Panel; Future - CBC with Differential/Platelet; Future - COMPLETE METABOLIC PANEL WITH GFR; Future  3. Seasonal allergies Stable on xyzal.   4. Leukopenia, unspecified type -slight decrease in WBC noted, this has been stable but will continue to follow. - CBC with Differential/Platelet - CBC with Differential/Platelet; Future  5. Age-related osteoporosis without current pathological fracture -continues on fosamax with cal and vit d, encouraged weight bearing activity. - COMPLETE METABOLIC PANEL WITH GFR; Future   Next  appt: yearly with fasting labs prior  Katurah Karapetian K. [place], [facility] [phone]

## 2020-11-10 ENCOUNTER — Encounter: Payer: Medicare Other | Admitting: Nurse Practitioner

## 2021-04-05 ENCOUNTER — Ambulatory Visit (INDEPENDENT_AMBULATORY_CARE_PROVIDER_SITE_OTHER): Payer: BC Managed Care – PPO | Admitting: Nurse Practitioner

## 2021-04-05 ENCOUNTER — Ambulatory Visit
Admission: RE | Admit: 2021-04-05 | Discharge: 2021-04-05 | Disposition: A | Discharge status: Home / Self Care | Payer: BC Managed Care – PPO | Source: Ambulatory Visit | Attending: Nurse Practitioner | Admitting: Nurse Practitioner

## 2021-04-05 ENCOUNTER — Encounter: Payer: Self-pay | Admitting: Nurse Practitioner

## 2021-04-05 ENCOUNTER — Other Ambulatory Visit: Payer: Self-pay

## 2021-04-05 VITALS — BP 126/78 | HR 81 | Temp 96.9°F | Ht 65.0 in | Wt 173.0 lb

## 2021-04-05 DIAGNOSIS — E782 Mixed hyperlipidemia: Secondary | ICD-10-CM | POA: Diagnosis not present

## 2021-04-05 DIAGNOSIS — Z8249 Family history of ischemic heart disease and other diseases of the circulatory system: Secondary | ICD-10-CM | POA: Diagnosis not present

## 2021-04-05 DIAGNOSIS — R5383 Other fatigue: Secondary | ICD-10-CM

## 2021-04-05 DIAGNOSIS — R0602 Shortness of breath: Secondary | ICD-10-CM

## 2021-04-05 MED ORDER — ATORVASTATIN CALCIUM 20 MG PO TABS: 20.0000 mg | ORAL_TABLET | Freq: Every day | ORAL | 1 refills | Status: DC

## 2021-04-05 NOTE — Progress Notes (Signed)
Careteam: Patient Care Team: Lauree Chandler, NP as PCP - General (Geriatric Medicine) Hale Bogus., MD as Referring Physician (Gastroenterology) Marylynn Pearson, MD as Consulting Physician (Obstetrics and Gynecology) Bjorn Loser, MD as Consulting Physician (Urology) Jacelyn Pi, MD as Consulting Physician (Endocrinology)  PLACE OF SERVICE:  [facility]  Advanced Directive information    Allergies  Allergen Reactions  . Fish Allergy     Any fish in the flounder family causes rash, difficulty breathing, swelling of eyes, itching, water/swelling under eyes  . Penicillins     Swelling of the lips and a rash  . Tape     Localized rash    Chief Complaint  Patient presents with  . Acute Visit    Patient c/o SOB, heart pounding, head hurts, face redness, and thighs burn x 2 years with exertion, progressively getting worse. Patient with splitting headache when awaking in the morning.      HPI: Patient is a 70 y.o. female with progressive shortness of breath  Dry hacking cough since before COVID and has continued to persist.  Also noted some worsening shortness of breath with exertion.  She is able to walk but as soon as she has to change elevation- got up hills or step she gets very winding and her chest starts pumping and head hurts. Reports she walked all around [place], 30K steps and was fine until she had to climb steps and was so winded she had to stop.   Gradually getting worse over the last 2 years but signficantly worse in the last week No diaphoresis, no chest pains.   Increase fatigued, if she does anything she reports exhaustion. No blood loss noted.  Her father with CAD s/p CABG x5 and CHF   Nonsmoker.   Review of Systems:  Review of Systems  Constitutional: Positive for malaise/fatigue. Negative for chills, fever and weight loss.  HENT: Negative for tinnitus.   Respiratory: Positive for cough. Negative for sputum production and shortness of  breath.   Cardiovascular: Positive for palpitations. Negative for chest pain, orthopnea, claudication, leg swelling and PND.  Gastrointestinal: Negative for abdominal pain, constipation, diarrhea and heartburn.  Genitourinary: Negative for dysuria, frequency and urgency.  Musculoskeletal: Negative for back pain, falls, joint pain and myalgias.  Skin: Negative.   Neurological: Negative for dizziness, weakness and headaches.    Past Medical History:  Diagnosis Date  . Hyperlipidemia   . Osteoporosis   . Seasonal allergies    Past Surgical History:  Procedure Laterality Date  . hysterectomy     Dr. Bartolo Darter  . WISDOM TOOTH EXTRACTION  1974   Social History:   reports that she has never smoked. She has never used smokeless tobacco. She reports current alcohol use. She reports that she does not use drugs.  Family History  Problem Relation Age of Onset  . Dementia Mother 54  . Osteoporosis Mother   . Heart failure Father        Deceased in 03/20/21   . Atrial fibrillation Father   . Sick sinus syndrome Father   . Heart disease Father   . Diabetes Maternal Grandmother   . Arthritis Maternal Grandfather   . Dementia Maternal Grandfather   . Cancer Paternal Grandmother   . Heart attack Paternal Uncle   . Stroke Paternal Aunt     Medications: Patient's Medications  New Prescriptions   No medications on file  Previous Medications   ACETAMINOPHEN (TYLENOL) 500 MG TABLET  Take 1,000 mg by mouth as needed.   ATORVASTATIN (LIPITOR) 20 MG TABLET    Take 1 tablet (20 mg total) by mouth daily.   LEVOCETIRIZINE (XYZAL) 5 MG TABLET    Take 5 mg by mouth every evening.   MULTIPLE MINERALS-VITAMINS (CITRACAL MAXIMUM PLUS) TABS    Take 2 tablets by mouth daily at 12 noon.   OMEGA-3 FATTY ACIDS (FISH OIL PO)    Take by mouth daily.   OXYBUTYNIN (DITROPAN-XL) 10 MG 24 HR TABLET    Take 10 mg by mouth at bedtime.   TIZANIDINE (ZANAFLEX) 2 MG CAPSULE    Take 1 capsule (2 mg total)  by mouth 3 (three) times daily as needed for muscle spasms.  Modified Medications   No medications on file  Discontinued Medications   ALENDRONATE (FOSAMAX) 70 MG TABLET    TAKE 1 TABLET BY MOUTH ONCE WEEKLY   CALCIUM-PHOSPHORUS-VITAMIN D (CITRACAL +D3 PO)    Take 1 tablet by mouth daily.   CHOLECALCIFEROL (VITAMIN D3) 2000 UNITS TABS    Take 1 tablet by mouth daily.   FLUTICASONE (FLONASE) 50 MCG/ACT NASAL SPRAY    Place 2 sprays into both nostrils daily as needed. As needed   GLUCOSAMINE-CHONDROITIN PO    Take 1 tablet by mouth daily.   SOLIFENACIN (VESICARE) 10 MG TABLET    Take 10 mg by mouth.   UNABLE TO FIND    Take 1 Dose by mouth daily. Nutrifil Optimal - V: heart, eyes, skin and lungs   UNABLE TO FIND    Take 1 Dose by mouth daily. Nutrifil Optimal- M: bones, nerves, and muscles   VITAMIN E PO    Take by mouth. Take 1 by mouth daily    Physical Exam:  Vitals:   04/05/21 1127  BP: 126/78  Pulse: 81  Temp: (!) 96.9 F (36.1 C)  TempSrc: Temporal  SpO2: 97%  Weight: 173 lb (78.5 kg)  Height: 5\' 5"  (1.651 m)   Body mass index is 28.79 kg/m. Wt Readings from Last 3 Encounters:  04/05/21 173 lb (78.5 kg)  06/08/20 165 lb 12.8 oz (75.2 kg)  11/18/19 174 lb 9.6 oz (79.2 kg)    Physical Exam Constitutional:      General: She is not in acute distress.    Appearance: She is well-developed. She is not diaphoretic.  HENT:     Head: Normocephalic and atraumatic.     Mouth/Throat:     Pharynx: No oropharyngeal exudate.  Eyes:     Conjunctiva/sclera: Conjunctivae normal.     Pupils: Pupils are equal, round, and reactive to light.  Cardiovascular:     Rate and Rhythm: Normal rate and regular rhythm.     Heart sounds: Normal heart sounds.  Pulmonary:     Effort: Pulmonary effort is normal.     Breath sounds: Normal breath sounds.  Abdominal:     General: Bowel sounds are normal.     Palpations: Abdomen is soft.  Musculoskeletal:        General: No tenderness.      Cervical back: Normal range of motion and neck supple.  Skin:    General: Skin is warm and dry.  Neurological:     Mental Status: She is alert and oriented to person, place, and time.  Psychiatric:        Mood and Affect: Mood normal.     Labs reviewed: Basic Metabolic Panel: Recent Labs    06/08/20 1004  NA 140  K 4.2  CL 102  CO2 31  GLUCOSE 93  BUN 19  CREATININE 1.02*  CALCIUM 9.6   Liver Function Tests: Recent Labs    06/08/20 1004  AST 20  ALT 18  BILITOT 0.5  PROT 6.9   No results for input(s): LIPASE, AMYLASE in the last 8760 hours. No results for input(s): AMMONIA in the last 8760 hours. CBC: Recent Labs    06/08/20 1004  WBC 3.3*  NEUTROABS 1,914  HGB 14.2  HCT 41.6  MCV 91.6  PLT 205   Lipid Panel: Recent Labs    06/08/20 1004  CHOL 168  HDL 78  LDLCALC 74  TRIG 76  CHOLHDL 2.2   TSH: No results for input(s): TSH in the last 8760 hours. A1C: Lab Results  Component Value Date   HGBA1C 5.2 05/13/2018     Assessment/Plan 1. SOB (shortness of breath) Progressively worse shortness of breath with increase in activity or elevation.  - EKG 12-Lead- SR rate 73 - TSH - COMPLETE METABOLIC PANEL WITH GFR - CBC with Differential/Platelet - DG Chest 2 View; Future - Ambulatory referral to Cardiology for further evaluation due to shortness of breath with exertion and family hx of heart disease.   2. Fatigue, unspecified type -progressive fatigue noted, she has had a stressful and busy last few months but has noticed falling asleep during the day more than she used to. Will r/o lab abnormality for cause - TSH - COMPLETE METABOLIC PANEL WITH GFR - CBC with Differential/Platelet - DG Chest 2 View; Future - Ambulatory referral to Cardiology- due to family of heart disease,hyperlipidemia and shortness of breath with activity  3. Mixed hyperlipidemia -continue heart heathy diet - atorvastatin (LIPITOR) 20 MG tablet; Take 1 tablet (20 mg  total) by mouth daily.  Dispense: 90 tablet; Refill: 1 - Lipid Panel  4. Family history of heart disease - Ambulatory referral to Cardiology   Strict follow up precautions given.  Carlos American. [place], [facility] [phone]

## 2021-04-06 LAB — COMPLETE METABOLIC PANEL WITH GFR
AG Ratio: 1.7 (calc) (ref 1.0–2.5)
ALT: 24 U/L (ref 6–29)
AST: 24 U/L (ref 10–35)
Albumin: 4.6 g/dL (ref 3.6–5.1)
Alkaline phosphatase (APISO): 50 U/L (ref 37–153)
BUN: 18 mg/dL (ref 7–25)
CO2: 30 mmol/L (ref 20–32)
Calcium: 9.6 mg/dL (ref 8.6–10.4)
Chloride: 103 mmol/L (ref 98–110)
Creat: 0.78 mg/dL (ref 0.50–0.99)
GFR, Est African American: 91 mL/min/{1.73_m2} (ref >=60)
GFR, Est Non African American: 79 mL/min/{1.73_m2} (ref >=60)
Globulin: 2.7 g/dL (calc) (ref 1.9–3.7)
Glucose, Bld: 88 mg/dL (ref 65–139)
Potassium: 4.1 mmol/L (ref 3.5–5.3)
Sodium: 141 mmol/L (ref 135–146)
Total Bilirubin: 0.4 mg/dL (ref 0.2–1.2)
Total Protein: 7.3 g/dL (ref 6.1–8.1)

## 2021-04-06 LAB — CBC WITH DIFFERENTIAL/PLATELET
Absolute Monocytes: 281 cells/uL (ref 200–950)
Basophils Absolute: 19 cells/uL (ref 0–200)
Basophils Relative: 0.5 %
Eosinophils Absolute: 91 cells/uL (ref 15–500)
Eosinophils Relative: 2.4 %
HCT: 43.8 % (ref 35.0–45.0)
Hemoglobin: 14.5 g/dL (ref 11.7–15.5)
Lymphs Abs: 939 cells/uL (ref 850–3900)
MCH: 30.6 pg (ref 27.0–33.0)
MCHC: 33.1 g/dL (ref 32.0–36.0)
MCV: 92.4 fL (ref 80.0–100.0)
MPV: 9.1 fL (ref 7.5–12.5)
Monocytes Relative: 7.4 %
Neutro Abs: 2470 cells/uL (ref 1500–7800)
Neutrophils Relative %: 65 %
Platelets: 196 10*3/uL (ref 140–400)
RBC: 4.74 10*6/uL (ref 3.80–5.10)
RDW: 12.2 % (ref 11.0–15.0)
Total Lymphocyte: 24.7 %
WBC: 3.8 10*3/uL (ref 3.8–10.8)

## 2021-04-06 LAB — LIPID PANEL
Cholesterol: 198 mg/dL (ref <200)
HDL: 91 mg/dL (ref >=50)
LDL Cholesterol (Calc): 91 mg/dL (calc)
Non-HDL Cholesterol (Calc): 107 mg/dL (calc) (ref <130)
Total CHOL/HDL Ratio: 2.2 (calc) (ref <5.0)
Triglycerides: 70 mg/dL (ref <150)

## 2021-04-06 LAB — TSH: TSH: 3.21 mIU/L (ref 0.40–4.50)

## 2021-05-23 ENCOUNTER — Ambulatory Visit: Payer: BC Managed Care – PPO | Admitting: Cardiology

## 2021-06-06 ENCOUNTER — Other Ambulatory Visit: Payer: Medicare Other

## 2021-06-06 ENCOUNTER — Other Ambulatory Visit: Payer: Self-pay

## 2021-06-06 DIAGNOSIS — E782 Mixed hyperlipidemia: Secondary | ICD-10-CM

## 2021-06-06 DIAGNOSIS — D72819 Decreased white blood cell count, unspecified: Secondary | ICD-10-CM

## 2021-06-06 DIAGNOSIS — M810 Age-related osteoporosis without current pathological fracture: Secondary | ICD-10-CM

## 2021-06-07 LAB — CBC WITH DIFFERENTIAL/PLATELET
Absolute Monocytes: 253 cells/uL (ref 200–950)
Basophils Absolute: 19 cells/uL (ref 0–200)
Basophils Relative: 0.6 %
Eosinophils Absolute: 112 cells/uL (ref 15–500)
Eosinophils Relative: 3.5 %
HCT: 43.3 % (ref 35.0–45.0)
Hemoglobin: 14.4 g/dL (ref 11.7–15.5)
Lymphs Abs: 1091 cells/uL (ref 850–3900)
MCH: 30.7 pg (ref 27.0–33.0)
MCHC: 33.3 g/dL (ref 32.0–36.0)
MCV: 92.3 fL (ref 80.0–100.0)
MPV: 8.9 fL (ref 7.5–12.5)
Monocytes Relative: 7.9 %
Neutro Abs: 1725 cells/uL (ref 1500–7800)
Neutrophils Relative %: 53.9 %
Platelets: 245 10*3/uL (ref 140–400)
RBC: 4.69 10*6/uL (ref 3.80–5.10)
RDW: 12.1 % (ref 11.0–15.0)
Total Lymphocyte: 34.1 %
WBC: 3.2 10*3/uL — ABNORMAL LOW (ref 3.8–10.8)

## 2021-06-07 LAB — COMPLETE METABOLIC PANEL WITH GFR
AG Ratio: 1.8 (calc) (ref 1.0–2.5)
ALT: 31 U/L — ABNORMAL HIGH (ref 6–29)
AST: 26 U/L (ref 10–35)
Albumin: 4.3 g/dL (ref 3.6–5.1)
Alkaline phosphatase (APISO): 57 U/L (ref 37–153)
BUN: 21 mg/dL (ref 7–25)
CO2: 22 mmol/L (ref 20–32)
Calcium: 9.7 mg/dL (ref 8.6–10.4)
Chloride: 105 mmol/L (ref 98–110)
Creat: 0.79 mg/dL (ref 0.50–0.99)
GFR, Est African American: 90 mL/min/{1.73_m2} (ref >=60)
GFR, Est Non African American: 77 mL/min/{1.73_m2} (ref >=60)
Globulin: 2.4 g/dL (calc) (ref 1.9–3.7)
Glucose, Bld: 93 mg/dL (ref 65–99)
Potassium: 4.2 mmol/L (ref 3.5–5.3)
Sodium: 139 mmol/L (ref 135–146)
Total Bilirubin: 0.5 mg/dL (ref 0.2–1.2)
Total Protein: 6.7 g/dL (ref 6.1–8.1)

## 2021-06-07 LAB — LIPID PANEL
Cholesterol: 181 mg/dL (ref <200)
HDL: 83 mg/dL (ref >=50)
LDL Cholesterol (Calc): 81 mg/dL (calc)
Non-HDL Cholesterol (Calc): 98 mg/dL (calc) (ref <130)
Total CHOL/HDL Ratio: 2.2 (calc) (ref <5.0)
Triglycerides: 85 mg/dL (ref <150)

## 2021-06-09 ENCOUNTER — Other Ambulatory Visit: Payer: Self-pay

## 2021-06-09 ENCOUNTER — Ambulatory Visit (INDEPENDENT_AMBULATORY_CARE_PROVIDER_SITE_OTHER): Payer: BC Managed Care – PPO | Admitting: Nurse Practitioner

## 2021-06-09 ENCOUNTER — Encounter: Payer: Self-pay | Admitting: Nurse Practitioner

## 2021-06-09 VITALS — BP 124/72 | HR 78 | Temp 97.1°F | Ht 65.5 in | Wt 171.0 lb

## 2021-06-09 DIAGNOSIS — N3281 Overactive bladder: Secondary | ICD-10-CM | POA: Diagnosis not present

## 2021-06-09 DIAGNOSIS — J302 Other seasonal allergic rhinitis: Secondary | ICD-10-CM

## 2021-06-09 DIAGNOSIS — M810 Age-related osteoporosis without current pathological fracture: Secondary | ICD-10-CM

## 2021-06-09 DIAGNOSIS — E782 Mixed hyperlipidemia: Secondary | ICD-10-CM

## 2021-06-09 DIAGNOSIS — R059 Cough, unspecified: Secondary | ICD-10-CM

## 2021-06-09 DIAGNOSIS — Z0000 Encounter for general adult medical examination without abnormal findings: Secondary | ICD-10-CM

## 2021-06-09 MED ORDER — PANTOPRAZOLE SODIUM 40 MG PO TBEC: 40.0000 mg | DELAYED_RELEASE_TABLET | Freq: Every day | ORAL | 3 refills | Status: DC

## 2021-06-09 NOTE — Patient Instructions (Signed)
To start Protonix- take for 4 weeks to see if this helps cough and mucous     Health Maintenance, Female Adopting a healthy lifestyle and getting preventive care are important in promoting health and wellness. Ask your health care provider about: The right schedule for you to have regular tests and exams. Things you can do on your own to prevent diseases and keep yourself healthy. What should I know about diet, weight, and exercise? Eat a healthy diet  Eat a diet that includes plenty of vegetables, fruits, low-fat dairy products, and lean protein. Do not eat a lot of foods that are high in solid fats, added sugars, or sodium.  Maintain a healthy weight Body mass index (BMI) is used to identify weight problems. It estimates body fat based on height and weight. Your health care provider can help determineyour BMI and help you achieve or maintain a healthy weight. Get regular exercise Get regular exercise. This is one of the most important things you can do for your health. Most adults should: Exercise for at least 150 minutes each week. The exercise should increase your heart rate and make you sweat (moderate-intensity exercise). Do strengthening exercises at least twice a week. This is in addition to the moderate-intensity exercise. Spend less time sitting. Even light physical activity can be beneficial. Watch cholesterol and blood lipids Have your blood tested for lipids and cholesterol at 68 years of age, then havethis test every 5 years. Have your cholesterol levels checked more often if: Your lipid or cholesterol levels are high. You are older than 68 years of age. You are at high risk for heart disease. What should I know about cancer screening? Depending on your health history and family history, you may need to have cancer screening at various ages. This may include screening for: Breast cancer. Cervical cancer. Colorectal cancer. Skin cancer. Lung cancer. What should I know  about heart disease, diabetes, and high blood pressure? Blood pressure and heart disease High blood pressure causes heart disease and increases the risk of stroke. This is more likely to develop in people who have high blood pressure readings, are of African descent, or are overweight. Have your blood pressure checked: Every 3-5 years if you are 10-26 years of age. Every year if you are 38 years old or older. Diabetes Have regular diabetes screenings. This checks your fasting blood sugar level. Have the screening done: Once every three years after age 3 if you are at a normal weight and have a low risk for diabetes. More often and at a younger age if you are overweight or have a high risk for diabetes. What should I know about preventing infection? Hepatitis B If you have a higher risk for hepatitis B, you should be screened for this virus. Talk with your health care provider to find out if you are at risk forhepatitis B infection. Hepatitis C Testing is recommended for: Everyone born from [birth year] through 1963. Anyone with known risk factors for hepatitis C. Sexually transmitted infections (STIs) Get screened for STIs, including gonorrhea and chlamydia, if: You are sexually active and are younger than 68 years of age. You are older than 68 years of age and your health care provider tells you that you are at risk for this type of infection. Your sexual activity has changed since you were last screened, and you are at increased risk for chlamydia or gonorrhea. Ask your health care provider if you are at risk. Ask your health care provider about  whether you are at high risk for HIV. Your health care provider may recommend a prescription medicine to help prevent HIV infection. If you choose to take medicine to prevent HIV, you should first get tested for HIV. You should then be tested every 3 months for as long as you are taking the medicine. Pregnancy If you are about to stop having your period  (premenopausal) and you may become pregnant, seek counseling before you get pregnant. Take 400 to 800 micrograms (mcg) of folic acid every day if you become pregnant. Ask for birth control (contraception) if you want to prevent pregnancy. Osteoporosis and menopause Osteoporosis is a disease in which the bones lose minerals and strength with aging. This can result in bone fractures. If you are 61 years old or older, or if you are at risk for osteoporosis and fractures, ask your health care provider if you should: Be screened for bone loss. Take a calcium or vitamin D supplement to lower your risk of fractures. Be given hormone replacement therapy (HRT) to treat symptoms of menopause. Follow these instructions at home: Lifestyle Do not use any products that contain nicotine or tobacco, such as cigarettes, e-cigarettes, and chewing tobacco. If you need help quitting, ask your health care provider. Do not use street drugs. Do not share needles. Ask your health care provider for help if you need support or information about quitting drugs. Alcohol use Do not drink alcohol if: Your health care provider tells you not to drink. You are pregnant, may be pregnant, or are planning to become pregnant. If you drink alcohol: Limit how much you use to 0-1 drink a day. Limit intake if you are breastfeeding. Be aware of how much alcohol is in your drink. In the [place] one drink equals one 12 oz bottle of beer (355 mL), one 5 oz glass of wine (148 mL), or one 1 oz glass of hard liquor (44 mL). General instructions Schedule regular health, dental, and eye exams. Stay current with your vaccines. Tell your health care provider if: You often feel depressed. You have ever been abused or do not feel safe at home. Summary Adopting a healthy lifestyle and getting preventive care are important in promoting health and wellness. Follow your health care provider's instructions about healthy diet, exercising, and  getting tested or screened for diseases. Follow your health care provider's instructions on monitoring your cholesterol and blood pressure. This information is not intended to replace advice given to you by your health care provider. Make sure you discuss any questions you have with your healthcare provider. Document Revised: 12/10/2018 Document Reviewed: 12/10/2018 Elsevier Patient Education  2022 [employer].

## 2021-06-09 NOTE — Progress Notes (Signed)
Careteam: Patient Care Team: Lauree Chandler, NP as PCP - General (Geriatric Medicine) Hale Bogus., MD as Referring Physician (Gastroenterology) Marylynn Pearson, MD as Consulting Physician (Obstetrics and Gynecology) Bjorn Loser, MD as Consulting Physician (Urology) Jacelyn Pi, MD as Consulting Physician (Endocrinology)  PLACE OF SERVICE:  [place] Directive information Does Patient Have a Medical Advance Directive?: Yes, Type of Advance Directive: Living will, Does patient want to make changes to medical advance directive?: No - Patient declined  Allergies  Allergen Reactions   Fish Allergy     Any fish in the flounder family causes rash, difficulty breathing, swelling of eyes, itching, water/swelling under eyes   Penicillins     Swelling of the lips and a rash   Tape     Localized rash    Chief Complaint  Patient presents with   Annual Exam    Yearly physical, moderate fall risk. Patient with pending appointment for mammogram      HPI: Patient is a 70 y.o. female for annual exam.   Rescheduled her cardiology appt due to being out of town to help take care of grandchildren.  Will be seen the middle of July.   For 3 weeks she has had cough, she feels like she has sinuses. Has post-nasal drip. Reports she takes dayquil and nightquil.  Started flonase a few days ago and that has helped. She takes xzyal since October- has been taking this every night.  Sputum clear or milky.   Denies GERD symptoms  Has appt for mammogram, she has one done in august of 2021  Had COVID shot and booster in [place].   Gets bone density through [facility], had last year.   No routine exercise. No excuse.   Review of Systems:  Review of Systems  Constitutional:  Negative for chills, fever and weight loss.  HENT:  Negative for tinnitus.   Respiratory:  Positive for cough and shortness of breath. Negative for sputum production.   Cardiovascular:  Negative for chest  pain, palpitations and leg swelling.  Gastrointestinal:  Negative for abdominal pain, constipation, diarrhea and heartburn.  Genitourinary:  Negative for dysuria, frequency and urgency.  Musculoskeletal:  Negative for back pain, falls, joint pain and myalgias.  Skin: Negative.   Neurological:  Negative for dizziness and headaches.  Psychiatric/Behavioral:  Negative for depression and memory loss. The patient does not have insomnia.    Past Medical History:  Diagnosis Date   Hyperlipidemia    Osteoporosis    Seasonal allergies    Past Surgical History:  Procedure Laterality Date   hysterectomy     Dr. Bartolo Darter   WISDOM TOOTH EXTRACTION  1974   Social History:   reports that she has never smoked. She has never used smokeless tobacco. She reports current alcohol use. She reports that she does not use drugs.  Family History  Problem Relation Age of Onset   Dementia Mother 6   Osteoporosis Mother    Heart failure Father        Deceased in March 18, 2021    Atrial fibrillation Father    Sick sinus syndrome Father    Heart disease Father    Diabetes Maternal Grandmother    Arthritis Maternal Grandfather    Dementia Maternal Grandfather    Cancer Paternal Grandmother    Heart attack Paternal Uncle    Stroke Paternal Aunt     Medications: Patient's Medications  New Prescriptions   No medications on file  Previous Medications   ACETAMINOPHEN (TYLENOL) 500 MG TABLET    Take 1,000 mg by mouth as needed.   ATORVASTATIN (LIPITOR) 20 MG TABLET    Take 1 tablet (20 mg total) by mouth daily.   FLUTICASONE (FLONASE) 50 MCG/ACT NASAL SPRAY    Place 2 sprays into both nostrils at bedtime.   LEVOCETIRIZINE (XYZAL) 5 MG TABLET    Take 5 mg by mouth every evening.   MULTIPLE MINERALS-VITAMINS (CITRACAL MAXIMUM PLUS) TABS    Take 2 tablets by mouth daily at 12 noon.   OMEGA-3 FATTY ACIDS (FISH OIL PO)    Take by mouth daily.   OXYBUTYNIN (DITROPAN-XL) 10 MG 24 HR TABLET    Take 10 mg by  mouth at bedtime.   TIZANIDINE (ZANAFLEX) 2 MG CAPSULE    Take 1 capsule (2 mg total) by mouth 3 (three) times daily as needed for muscle spasms.  Modified Medications   No medications on file  Discontinued Medications   No medications on file    Physical Exam:  Vitals:   06/09/21 0906  BP: 124/72  Pulse: 78  Temp: (!) 97.1 F (36.2 C)  TempSrc: Temporal  SpO2: 96%  Weight: 171 lb (77.6 kg)  Height: 5' 5.5" (1.664 m)   Body mass index is 28.02 kg/m. Wt Readings from Last 3 Encounters:  06/09/21 171 lb (77.6 kg)  04/05/21 173 lb (78.5 kg)  06/08/20 165 lb 12.8 oz (75.2 kg)    Physical Exam Constitutional:      General: She is not in acute distress.    Appearance: She is well-developed. She is not diaphoretic.  HENT:     Head: Normocephalic and atraumatic.     Right Ear: Tympanic membrane, ear canal and external ear normal. There is no impacted cerumen.     Left Ear: Tympanic membrane, ear canal and external ear normal. There is no impacted cerumen.     Nose: Nose normal.     Mouth/Throat:     Pharynx: No oropharyngeal exudate or posterior oropharyngeal erythema.  Eyes:     Conjunctiva/sclera: Conjunctivae normal.     Pupils: Pupils are equal, round, and reactive to light.  Cardiovascular:     Rate and Rhythm: Normal rate and regular rhythm.     Heart sounds: Normal heart sounds.  Pulmonary:     Effort: Pulmonary effort is normal.     Breath sounds: Normal breath sounds.  Abdominal:     General: Bowel sounds are normal.     Palpations: Abdomen is soft.  Musculoskeletal:     Cervical back: Normal range of motion and neck supple.     Right lower leg: No edema.     Left lower leg: No edema.  Skin:    General: Skin is warm and dry.  Neurological:     Mental Status: She is alert and oriented to person, place, and time.  Psychiatric:        Mood and Affect: Mood normal.    Labs reviewed: Basic Metabolic Panel: Recent Labs    04/05/21 0000 06/06/21 1010   NA 141 139  K 4.1 4.2  CL 103 105  CO2 30 22  GLUCOSE 88 93  BUN 18 21  CREATININE 0.78 0.79  CALCIUM 9.6 9.7  TSH 3.21  --    Liver Function Tests: Recent Labs    04/05/21 0000 06/06/21 1010  AST 24 26  ALT 24 31*  BILITOT 0.4 0.5  PROT 7.3 6.7   No  results for input(s): LIPASE, AMYLASE in the last 8760 hours. No results for input(s): AMMONIA in the last 8760 hours. CBC: Recent Labs    04/05/21 0000 06/06/21 1010  WBC 3.8 3.2*  NEUTROABS 2,470 1,725  HGB 14.5 14.4  HCT 43.8 43.3  MCV 92.4 92.3  PLT 196 245   Lipid Panel: Recent Labs    04/05/21 0000 06/06/21 1010  CHOL 198 181  HDL 91 83  LDLCALC 91 81  TRIG 70 85  CHOLHDL 2.2 2.2   TSH: Recent Labs    04/05/21 0000  TSH 3.21   A1C: Lab Results  Component Value Date   HGBA1C 5.2 05/13/2018     Assessment/Plan 1. Cough Ongoing cough, ?related to acid reflux.  - pantoprazole (PROTONIX) 40 MG tablet; Take 1 tablet (40 mg total) by mouth daily.  Dispense: 30 tablet; Refill: 3 to see if this improves symptoms.  2. Mixed hyperlipidemia -LDL stable. Continue on lipitor with dietary modifications.  3. Overactive bladder Stable, Has made modifications to diet and lifestyle. Does not always take oxybutynin due to side effects.   4. Seasonal allergies Continues on xyzal with flonase  5. Age-related osteoporosis without current pathological fracture Bone density followed by GYN, continues on fosamax with vit d, calcium and recommended weight bearing exercises.   6. Preventative health care -completed annual exam today. Keeping up with GYN for mammogram and bone density, also does pelvic and breast exam.  The patient was counseled regarding the appropriate use of alcohol, regular self-examination of the breasts on a monthly basis, prevention of dental and periodontal disease, diet, regular sustained exercise for at least 30 minutes 5 times per week, routine screening interval for mammogram as  recommended by the [place] and ACOG, importance of regular PAP smears, tobacco use,  and recommended schedule for GI hemoccult testing, colonoscopy, cholesterol, thyroid and diabetes screening.    Next appt: yearly, sooner if needed Ozie Dimaria K. Ardoch, [facility] [phone]

## 2021-06-15 ENCOUNTER — Other Ambulatory Visit: Payer: Self-pay | Admitting: Endocrinology

## 2021-06-15 DIAGNOSIS — E049 Nontoxic goiter, unspecified: Secondary | ICD-10-CM

## 2021-07-06 ENCOUNTER — Other Ambulatory Visit: Payer: BC Managed Care – PPO

## 2021-07-16 NOTE — Progress Notes (Signed)
Cardiology Office Note:    Date:  07/18/2021   ID:  Mary Short, DOB [birth year], MRN [id number]  PCP:  Lauree Chandler, NP   [facility] Providers Cardiologist:  None {  Referring MD: Lauree Chandler, NP    History of Present Illness:    Mary Short is a 70 y.o. female with a hx of HLD and family history of CAD who was referred for Mary Mustache, NP for further evaluation of shortness of breath on exertion  Today, the patient states that she overall feels okay. Has been having dyspnea on exertion with walking an incline or a flight of stairs but doing okay with walking on flat ground. Has been ongoing for the past several years. Symptoms have gotten slightly worse but she relates this to being less active during [place]. Once she stops, she is able to catch her breath and her symptoms resolve. No chest pain, lightheadedness, or dizziness. Has some associated palpitations. States she is concerned as her symptoms seem out-of-proportion to the activity she is performing and she is needing to take more breaks that previously.   Father with MI, carotid artery disease, and CHF (died at age 46). Daughter with bicuspid aortic valve and coarctation.  Past Medical History:  Diagnosis Date   Hyperlipidemia    Osteoporosis    Seasonal allergies     Past Surgical History:  Procedure Laterality Date   hysterectomy     Dr. Bartolo Darter   WISDOM TOOTH EXTRACTION  1974    Current Medications: Current Meds  Medication Sig   acetaminophen (TYLENOL) 500 MG tablet Take 1,000 mg by mouth as needed.   atorvastatin (LIPITOR) 20 MG tablet Take 1 tablet (20 mg total) by mouth daily.   fluticasone (FLONASE) 50 MCG/ACT nasal spray Place 2 sprays into both nostrils at bedtime.   levocetirizine (XYZAL) 5 MG tablet Take 5 mg by mouth every evening.   metoprolol tartrate (LOPRESSOR) 100 MG tablet Take 1 tablet (100 mg total) by mouth once for 1 dose. Take 2 hours prior to your Cardiac  CT.   Multiple Minerals-Vitamins (CITRACAL MAXIMUM PLUS) TABS Take 2 tablets by mouth daily at 12 noon.   Omega-3 Fatty Acids (FISH OIL PO) Take by mouth daily.   oxybutynin (DITROPAN-XL) 10 MG 24 hr tablet Take 10 mg by mouth at bedtime.   pantoprazole (PROTONIX) 40 MG tablet Take 1 tablet (40 mg total) by mouth daily.   tizanidine (ZANAFLEX) 2 MG capsule Take 1 capsule (2 mg total) by mouth 3 (three) times daily as needed for muscle spasms.     Allergies:   Fish allergy, Penicillins, and Tape   Social History   Socioeconomic History   Marital status: Married    Spouse name: Not on file   Number of children: Not on file   Years of education: Not on file   Highest education level: Not on file  Occupational History   Not on file  Tobacco Use   Smoking status: Never   Smokeless tobacco: Never  Vaping Use   Vaping Use: Never used  Substance and Sexual Activity   Alcohol use: Yes    Comment: generally 1 drink per month   Drug use: Never   Sexual activity: Not Currently  Other Topics Concern   Not on file  Social History Narrative   Social History      Diet? Low carb/sugar      Do you drink/eat things with caffeine? Iced tea, chocolate  Marital status?   married          What year were you married? 1977      Do you live in a house, apartment, assisted living, condo, trailer, etc.? house      Is it one or more stories? 2      How many persons live in your home? 2      Do you have any pets in your home? (please list) 2 dogs      Highest level of education completed? Masters degree      Current or past profession: Museum/gallery curator, Microbiologist      Do you exercise?         Very little                             Type & how often? Walk, some weights      Advanced Directives      Do you have a living will? no      Do you have a DNR form?    no                              If not, do you want to discuss one? No, not now      Do you have signed POA/HPOA  for forms? no      Functional Status      Do you have difficulty bathing or dressing yourself? no      Do you have difficulty preparing food or eating? no      Do you have difficulty managing your medications? no      Do you have difficulty managing your finances? no      Do you have difficulty affording your medications? no       Social Determinants of Health   Financial Resource Strain: Not on file  Food Insecurity: Not on file  Transportation Needs: Not on file  Physical Activity: Not on file  Stress: Not on file  Social Connections: Not on file     Family History: The patient's family history includes Arthritis in her maternal grandfather; Atrial fibrillation in her father; Cancer in her paternal grandmother; Dementia in her maternal grandfather; Dementia (age of onset: 48) in her mother; Diabetes in her maternal grandmother; Heart attack in her paternal uncle; Heart disease in her father; Heart failure in her father; Osteoporosis in her mother; Sick sinus syndrome in her father; Stroke in her paternal aunt.  ROS:   Please see the history of present illness.    Review of Systems  Constitutional:  Negative for chills and fever.  HENT:  Negative for sore throat.   Eyes:  Negative for blurred vision.  Respiratory:  Positive for shortness of breath.   Cardiovascular:  Positive for palpitations. Negative for chest pain, orthopnea, claudication, leg swelling and PND.  Gastrointestinal:  Negative for nausea and vomiting.  Genitourinary:  Negative for flank pain.  Musculoskeletal:  Negative for falls.  Neurological:  Negative for dizziness and loss of consciousness.  Psychiatric/Behavioral:  Negative for substance abuse.     EKGs/Labs/Other Studies Reviewed:    The following studies were reviewed today: No Cardiac Studies  EKG:  No new tracings  Recent Labs: 04/05/2021: TSH 3.21 06/06/2021: ALT 31; BUN 21; Creat 0.79; Hemoglobin 14.4; Platelets 245; Potassium 4.2; Sodium  139  Recent Lipid Panel  Component Value Date/Time   CHOL 181 06/06/2021 1010   TRIG 85 06/06/2021 1010   HDL 83 06/06/2021 1010   CHOLHDL 2.2 06/06/2021 1010   LDLCALC 81 06/06/2021 1010     Risk Assessment/Calculations:           Physical Exam:    VS:  BP 140/80   Pulse 79   Ht 5' 5.5" (1.664 m)   Wt 176 lb (79.8 kg)   SpO2 98%   BMI 28.84 kg/m     Wt Readings from Last 3 Encounters:  07/18/21 176 lb (79.8 kg)  06/09/21 171 lb (77.6 kg)  04/05/21 173 lb (78.5 kg)     GEN:  Well nourished, well developed in no acute distress HEENT: Normal NECK: No JVD; No carotid bruits CARDIAC: RRR, 1/6 systolic murmur. No rubs, gallops RESPIRATORY:  Clear to auscultation without rales, wheezing or rhonchi  ABDOMEN: Soft, non-tender, non-distended MUSCULOSKELETAL:  No edema; No deformity  SKIN: Warm and dry NEUROLOGIC:  Alert and oriented x 3 PSYCHIATRIC:  Normal affect   ASSESSMENT:    1. Precordial pain   2. SOB (shortness of breath)   3. DOE (dyspnea on exertion)   4. Family history of first degree relative with bicuspid aortic valve   5. Mixed hyperlipidemia    PLAN:    In order of problems listed above:  #SOB: Patient with progressive dyspnea on exertion especially when walking stairs and an incline. Has been ongoing for about 3 years but seems to be getting worse. States she is finding herself needing to stop and rest much more frequently lately which has been concerning to her. No chest pain but has associated palpitations. Family history notable for CAD and HF in her father. Given progressive nature of her symptoms, will check ischemic work-up. -Coronary CTA -Check TTE  #Family history of Coarctation and BiV: Patient's daughter with coarctation of aorta and bicsupid valve. No other family members screened. -Will check TTE -Discussed that her sons should also have TTE for screening   #HLD: -Continue lipitor 20mg  daily -Will adjust dosing pending CTA  findings  #Elevated blood pressure: Elevated in clinic today. Unsure what it runs at home.  -Keep BP log for 5 days and send in results -If consistent >130s/90s, will start low dose ARB  Medication Adjustments/Labs and Tests Ordered: Current medicines are reviewed at length with the patient today.  Concerns regarding medicines are outlined above.  Orders Placed This Encounter  Procedures   CT CORONARY MORPH W/CTA COR W/SCORE W/CA W/CM &/OR WO/CM   Basic metabolic panel   ECHOCARDIOGRAM COMPLETE    Meds ordered this encounter  Medications   metoprolol tartrate (LOPRESSOR) 100 MG tablet    Sig: Take 1 tablet (100 mg total) by mouth once for 1 dose. Take 2 hours prior to your Cardiac CT.    Dispense:  1 tablet    Refill:  0     Patient Instructions  Medication Instructions:   Your physician recommends that you continue on your current medications as directed. Please refer to the Current Medication list given to you today.  *If you need a refill on your cardiac medications before your next appointment, please call your pharmacy*   Testing/Procedures:  Your physician has requested that you have an echocardiogram. Echocardiography is a painless test that uses sound waves to create images of your heart. It provides your doctor with information about the size and shape of your heart and how well your heart's chambers and  valves are working. This procedure takes approximately one hour. There are no restrictions for this procedure.    Your cardiac CT will be scheduled at one of the below locations:   [facility] [address] [place], [place] [address] [phone]  If scheduled at [facility], please arrive at the [facility] main entrance (entrance A) of [facility] 30 minutes prior to test start time. Proceed to the [facility] Radiology Department (first floor) to check-in and test prep.  Please follow these instructions carefully (unless  otherwise directed):  On the Night Before the Test: Be sure to Drink plenty of water. Do not consume any caffeinated/decaffeinated beverages or chocolate 12 hours prior to your test. Do not take any antihistamines 12 hours prior to your test.  On the Day of the Test: Drink plenty of water until 1 hour prior to the test. Do not eat any food 4 hours prior to the test. You may take your regular medications prior to the test.  Take metoprolol 100 mg (Lopressor) two hours prior to test. FEMALES- please wear underwire-free bra if available, avoid dresses & tight clothing   After the Test: Drink plenty of water. After receiving IV contrast, you may experience a mild flushed feeling. This is normal. On occasion, you may experience a mild rash up to 24 hours after the test. This is not dangerous. If this occurs, you can take Benadryl 25 mg and increase your fluid intake. If you experience trouble breathing, this can be serious. If it is severe call 911 IMMEDIATELY. If it is mild, please call our office. If you take any of these medications: Glipizide/Metformin, Avandament, Glucavance, please do not take 48 hours after completing test unless otherwise instructed.  Please allow 2-4 weeks for scheduling of routine cardiac CTs. Some insurance companies require a pre-authorization which may delay scheduling of this test.   For non-scheduling related questions, please contact the cardiac imaging nurse navigator should you have any questions/concerns: Marchia Bond, Cardiac Imaging Nurse Navigator Gordy Clement, Cardiac Imaging Nurse Navigator [facility] Direct Office Dial: [phone]   For scheduling needs, including cancellations and rescheduling, please call [place], [phone].   Follow-Up: At [facility], you and your health needs are our priority.  As part of our continuing mission to provide you with exceptional heart care, we have created designated  Provider Care Teams.  These Care Teams include your primary Cardiologist (physician) and Advanced Practice Providers (APPs -  Physician Assistants and Nurse Practitioners) who all work together to provide you with the care you need, when you need it.  We recommend signing up for the patient portal called "MyChart".  Sign up information is provided on this After Visit Summary.  MyChart is used to connect with patients for Virtual Visits (Telemedicine).  Patients are able to view lab/test results, encounter notes, upcoming appointments, etc.  Non-urgent messages can be sent to your provider as well.   To learn more about what you can do with MyChart, go to [email or web address].    Your next appointment:   6 month(s)  The format for your next appointment:   In Person  Provider:   You may see Dr. Johney Frame or one of the following Advanced Practice Providers on your designated Care Team:   Richardson Dopp, PA-C Robbie Lis, [place]      Signed, Freada Bergeron, MD  07/18/2021 3:47 PM    [place]

## 2021-07-18 ENCOUNTER — Encounter: Payer: Self-pay | Admitting: Cardiology

## 2021-07-18 ENCOUNTER — Ambulatory Visit: Payer: PPO | Admitting: Cardiology

## 2021-07-18 ENCOUNTER — Other Ambulatory Visit: Payer: Self-pay

## 2021-07-18 VITALS — BP 140/80 | HR 79 | Ht 65.5 in | Wt 176.0 lb

## 2021-07-18 DIAGNOSIS — R0602 Shortness of breath: Secondary | ICD-10-CM | POA: Diagnosis not present

## 2021-07-18 DIAGNOSIS — Z8279 Family history of other congenital malformations, deformations and chromosomal abnormalities: Secondary | ICD-10-CM

## 2021-07-18 DIAGNOSIS — R072 Precordial pain: Secondary | ICD-10-CM

## 2021-07-18 DIAGNOSIS — R0600 Dyspnea, unspecified: Secondary | ICD-10-CM

## 2021-07-18 DIAGNOSIS — R0609 Other forms of dyspnea: Secondary | ICD-10-CM

## 2021-07-18 DIAGNOSIS — E782 Mixed hyperlipidemia: Secondary | ICD-10-CM | POA: Diagnosis not present

## 2021-07-18 MED ORDER — METOPROLOL TARTRATE 100 MG PO TABS: 100.0000 mg | ORAL_TABLET | Freq: Once | ORAL | 0 refills | Status: DC

## 2021-07-18 NOTE — Patient Instructions (Signed)
Medication Instructions:   Your physician recommends that you continue on your current medications as directed. Please refer to the Current Medication list given to you today.  *If you need a refill on your cardiac medications before your next appointment, please call your pharmacy*   Testing/Procedures:  Your physician has requested that you have an echocardiogram. Echocardiography is a painless test that uses sound waves to create images of your heart. It provides your doctor with information about the size and shape of your heart and how well your heart's chambers and valves are working. This procedure takes approximately one hour. There are no restrictions for this procedure.    Your cardiac CT will be scheduled at one of the below locations:   [facility] [address] [place], [place] [address] [phone]  If scheduled at [facility], please arrive at the [facility] main entrance (entrance A) of [facility] 30 minutes prior to test start time. Proceed to the [facility] Radiology Department (first floor) to check-in and test prep.  Please follow these instructions carefully (unless otherwise directed):  On the Night Before the Test: Be sure to Drink plenty of water. Do not consume any caffeinated/decaffeinated beverages or chocolate 12 hours prior to your test. Do not take any antihistamines 12 hours prior to your test.  On the Day of the Test: Drink plenty of water until 1 hour prior to the test. Do not eat any food 4 hours prior to the test. You may take your regular medications prior to the test.  Take metoprolol 100 mg (Lopressor) two hours prior to test. FEMALES- please wear underwire-free bra if available, avoid dresses & tight clothing   After the Test: Drink plenty of water. After receiving IV contrast, you may experience a mild flushed feeling. This is normal. On occasion, you may experience a mild rash up to 24 hours after the  test. This is not dangerous. If this occurs, you can take Benadryl 25 mg and increase your fluid intake. If you experience trouble breathing, this can be serious. If it is severe call 911 IMMEDIATELY. If it is mild, please call our office. If you take any of these medications: Glipizide/Metformin, Avandament, Glucavance, please do not take 48 hours after completing test unless otherwise instructed.  Please allow 2-4 weeks for scheduling of routine cardiac CTs. Some insurance companies require a pre-authorization which may delay scheduling of this test.   For non-scheduling related questions, please contact the cardiac imaging nurse navigator should you have any questions/concerns: Marchia Bond, Cardiac Imaging Nurse Navigator Gordy Clement, Cardiac Imaging Nurse Navigator [facility] Direct Office Dial: [phone]   For scheduling needs, including cancellations and rescheduling, please call [place], [phone].   Follow-Up: At [facility], you and your health needs are our priority.  As part of our continuing mission to provide you with exceptional heart care, we have created designated Provider Care Teams.  These Care Teams include your primary Cardiologist (physician) and Advanced Practice Providers (APPs -  Physician Assistants and Nurse Practitioners) who all work together to provide you with the care you need, when you need it.  We recommend signing up for the patient portal called "MyChart".  Sign up information is provided on this After Visit Summary.  MyChart is used to connect with patients for Virtual Visits (Telemedicine).  Patients are able to view lab/test results, encounter notes, upcoming appointments, etc.  Non-urgent messages can be sent to your provider as  well.   To learn more about what you can do with MyChart, go to [email or web address].    Your next appointment:   6 month(s)  The format for your next appointment:   In  Person  Provider:   You may see Dr. Johney Frame or one of the following Advanced Practice Providers on your designated Care Team:   Richardson Dopp, PA-C [place], [place]

## 2021-07-24 ENCOUNTER — Telehealth: Payer: Self-pay | Admitting: *Deleted

## 2021-07-24 NOTE — Telephone Encounter (Signed)
Spoke with the pt.  She will come for her BMET on 8/15, same day as she has her echo done with our office on that day.  Pt aware of appt.  Pt verbalized understanding and agrees with this plan.

## 2021-07-24 NOTE — Telephone Encounter (Signed)
-----   Message from Melony Overly sent at 07/24/2021 12:23 PM EDT ----- Regarding: ct heart Scheduled  08/16/21 at 8:30   She will need labs done.  Thanks, [place]

## 2021-08-11 DIAGNOSIS — Z6829 Body mass index (BMI) 29.0-29.9, adult: Secondary | ICD-10-CM | POA: Diagnosis not present

## 2021-08-11 DIAGNOSIS — Z1231 Encounter for screening mammogram for malignant neoplasm of breast: Secondary | ICD-10-CM | POA: Diagnosis not present

## 2021-08-11 DIAGNOSIS — Z124 Encounter for screening for malignant neoplasm of cervix: Secondary | ICD-10-CM | POA: Diagnosis not present

## 2021-08-12 ENCOUNTER — Other Ambulatory Visit: Payer: Self-pay | Admitting: Nurse Practitioner

## 2021-08-12 DIAGNOSIS — E782 Mixed hyperlipidemia: Secondary | ICD-10-CM

## 2021-08-14 ENCOUNTER — Other Ambulatory Visit: Payer: PPO | Admitting: *Deleted

## 2021-08-14 ENCOUNTER — Ambulatory Visit (HOSPITAL_COMMUNITY): Payer: PPO | Attending: Cardiology

## 2021-08-14 ENCOUNTER — Ambulatory Visit
Admission: RE | Admit: 2021-08-14 | Discharge: 2021-08-14 | Disposition: A | Discharge status: Home / Self Care | Payer: PPO | Source: Ambulatory Visit | Attending: Endocrinology | Admitting: Endocrinology

## 2021-08-14 ENCOUNTER — Other Ambulatory Visit: Payer: Self-pay

## 2021-08-14 DIAGNOSIS — R0602 Shortness of breath: Secondary | ICD-10-CM | POA: Diagnosis not present

## 2021-08-14 DIAGNOSIS — E041 Nontoxic single thyroid nodule: Secondary | ICD-10-CM | POA: Diagnosis not present

## 2021-08-14 DIAGNOSIS — R072 Precordial pain: Secondary | ICD-10-CM | POA: Insufficient documentation

## 2021-08-14 DIAGNOSIS — R0600 Dyspnea, unspecified: Secondary | ICD-10-CM | POA: Diagnosis not present

## 2021-08-14 DIAGNOSIS — R0609 Other forms of dyspnea: Secondary | ICD-10-CM

## 2021-08-14 DIAGNOSIS — E049 Nontoxic goiter, unspecified: Secondary | ICD-10-CM

## 2021-08-14 LAB — ECHOCARDIOGRAM COMPLETE
Area-P 1/2: 4.12 cm2
S' Lateral: 2.05 cm

## 2021-08-14 LAB — BASIC METABOLIC PANEL
BUN/Creatinine Ratio: 26 (ref 12–28)
BUN: 20 mg/dL (ref 8–27)
CO2: 25 mmol/L (ref 20–29)
Calcium: 9.6 mg/dL (ref 8.7–10.3)
Chloride: 103 mmol/L (ref 96–106)
Creatinine, Ser: 0.77 mg/dL (ref 0.57–1.00)
Glucose: 87 mg/dL (ref 65–99)
Potassium: 3.8 mmol/L (ref 3.5–5.2)
Sodium: 144 mmol/L (ref 134–144)
eGFR: 84 mL/min/{1.73_m2} (ref >59)

## 2021-08-14 LAB — HM PAP SMEAR: HM Pap smear: NEGATIVE

## 2021-08-15 ENCOUNTER — Telehealth: Payer: Self-pay | Admitting: *Deleted

## 2021-08-15 ENCOUNTER — Telehealth (HOSPITAL_COMMUNITY): Payer: Self-pay | Admitting: *Deleted

## 2021-08-15 NOTE — Telephone Encounter (Signed)
Attempted to call patient regarding upcoming cardiac CT appointment. °Left message on voicemail with name and callback number ° °Kolbie Clarkston RN Navigator Cardiac Imaging °[facility] °[phone] Office °[phone] Cell ° °

## 2021-08-15 NOTE — Telephone Encounter (Signed)
Pt dropped off several BP/HR recordings by the office, as last advised by Dr. Johney Frame.  Below are recordings from July to Aug:  7/24- AM 130/70 HR-67  PM-125/70 HR-82  7/25-  AM 112/68 HR-75 PM- 119/83 HR-79  7/26-  AM 112/76 HR-74 PM-115/72 HR-89  7/27- AM 118/76 HR-76 PM- 132/77 HR-89  7/28- AM 125/83 HR--74 PM- 133/78 HR-84  7/29-  AM 122/82 HR-74 PM-130/76 HR-79  7/30-  AM 127/82 HR-81 PM-120/78 HR-93  8/1-  AM- 115/83 HR-88  *119/79 HR-98-Pt mowed small lawn (100 F in [place])-taken 5 hours later after sitting inside to cool off  PM-115/80 HR-90  8/2-  AM 126/78 HR-73 PM-NO PM RECORDING REPORTED  8/7-AM 128/85-HR- 72 PM-140/90 HR-75  8/8-  AM -135/80 HR-65 PM-144/82 HR-78  8/9-  AM- 136/87 HR- 73 PM-137/80 HR-73  8/10-  AM- 124/86 HR-85 PM-114/71 HR-91  8/11-  AM- 113/76 HR-88 PM-119/74 HR-81  8/14- PM ONLY- 122/82 HR-87  Will forward BP recordings to Dr. Jacolyn Reedy in-basket for covering Cardiologist to further review and advise on readings.  Will follow-up with the pt accordingly thereafter.

## 2021-08-15 NOTE — Telephone Encounter (Signed)
Patient returning call regarding upcoming cardiac imaging study; pt verbalizes understanding of appt date/time, parking situation and where to check in, pre-test NPO status and medications ordered, and verified current allergies; name and call back number provided for further questions should they arise  Mary Clement RN Navigator Cardiac Imaging [facility] [phone] office [phone] cell  Patient to take '100mg'$  metoprolol tartrate two hours prior to cardiac CT.

## 2021-08-15 NOTE — Telephone Encounter (Signed)
Pt made aware of both her lab and echo results via mychart message sent by Dr. Gasper Sells.  Pt aware that per Dr. Gasper Sells, her BP is stable and current regimen should be continued.   Seen by patient Mary Short on 08/15/2021 10:29 AM

## 2021-08-15 NOTE — Telephone Encounter (Signed)
Seen by patient Mary Short on 08/15/2021 10:29 AM   George, LPN  [id number]  [id number] AM EDT     Left message for the pt to call back for results. Mychart message was sent through this result by Dr. Gasper Sells (covering for Dr. Johney Frame).    Nuala Alpha, LPN  D34-534  579FGE AM EDT     Pt made aware of normal BMET result for upcoming Cardiac CT, by Dr. Gasper Sells (covering for Dr. Johney Frame) via Deloris Ping message through this result note.  Will monitor for pt review.    Werner Lean, MD  08/15/2021  8:45 AM EDT     Results: Stable BP Plan: No barriers to Cardiac CT   Werner Lean, MD      Nuala Alpha, LPN  [id number] [id number] AM EDT     Seen by patient Mary Short on 08/15/2021 10:29 AM   Karlene Einstein Rockne Coons, LPN  [id number]  579FGE AM EDT     Left message for the pt to call back for results. Mychart message was sent through this result by Dr. Gasper Sells (covering for Dr. Johney Frame).    Werner Lean, MD  08/15/2021  8:55 AM EDT     Results: Normal LV size and function, no WMA Plan: No change in plan   Werner Lean, MD

## 2021-08-15 NOTE — Telephone Encounter (Signed)
Mary Short, Mary "Su" - 08/15/2021  7:26 AM Werner Lean, MD  Sent: Tue August 15, 2021  8:15 AM  To: Nuala Alpha, LPN    It looks likes most of her readings (18/28) are with BP under Dr. Lucy Antigua goal.  Will defer ARB start at this time.    ----- Message -----  From: Nuala Alpha, LPN  Sent: [id number]   7:43 AM EDT  To: Werner Lean, MD, *    Left a detailed message on the pts confirmed voicemail, that per Dr. Gasper Sells (covering for Dr. Johney Frame) her BP recordings are under Dr. Jacolyn Reedy goal for the pt, and she should continue her current med regimen.  Left her a detailed message to call the office back with any additional questions or concerns regarding this.

## 2021-08-16 ENCOUNTER — Other Ambulatory Visit: Payer: Self-pay

## 2021-08-16 ENCOUNTER — Encounter (HOSPITAL_COMMUNITY): Payer: Self-pay

## 2021-08-16 ENCOUNTER — Ambulatory Visit (HOSPITAL_COMMUNITY)
Admission: RE | Admit: 2021-08-16 | Discharge: 2021-08-16 | Disposition: A | Discharge status: Home / Self Care | Payer: PPO | Source: Ambulatory Visit | Attending: Cardiology | Admitting: Cardiology

## 2021-08-16 DIAGNOSIS — R0609 Other forms of dyspnea: Secondary | ICD-10-CM

## 2021-08-16 DIAGNOSIS — R072 Precordial pain: Secondary | ICD-10-CM | POA: Diagnosis not present

## 2021-08-16 DIAGNOSIS — R0600 Dyspnea, unspecified: Secondary | ICD-10-CM | POA: Diagnosis not present

## 2021-08-16 DIAGNOSIS — R0602 Shortness of breath: Secondary | ICD-10-CM | POA: Diagnosis not present

## 2021-08-16 MED ORDER — NITROGLYCERIN 0.4 MG SL SUBL
SUBLINGUAL_TABLET | SUBLINGUAL | Status: AC
  Administered 2021-08-16: 0.8 mg via SUBLINGUAL
  Filled 2021-08-16: qty 2

## 2021-08-16 MED ORDER — IOHEXOL 350 MG/ML SOLN
95.0000 mL | Freq: Once | INTRAVENOUS | Status: AC | PRN
  Administered 2021-08-16: 95 mL via INTRAVENOUS

## 2021-08-16 MED ORDER — NITROGLYCERIN 0.4 MG SL SUBL: 0.8000 mg | SUBLINGUAL_TABLET | Freq: Once | SUBLINGUAL | Status: AC

## 2021-09-08 DIAGNOSIS — Z23 Encounter for immunization: Secondary | ICD-10-CM | POA: Diagnosis not present

## 2021-10-13 ENCOUNTER — Other Ambulatory Visit: Payer: Self-pay | Admitting: Nurse Practitioner

## 2021-10-13 DIAGNOSIS — R059 Cough, unspecified: Secondary | ICD-10-CM

## 2021-10-27 DIAGNOSIS — Z20822 Contact with and (suspected) exposure to covid-19: Secondary | ICD-10-CM | POA: Diagnosis not present

## 2021-11-10 ENCOUNTER — Telehealth: Payer: Self-pay

## 2021-11-10 NOTE — Telephone Encounter (Signed)
Called and left message for patient to call office to schedule AWV.

## 2021-11-13 DIAGNOSIS — D224 Melanocytic nevi of scalp and neck: Secondary | ICD-10-CM | POA: Diagnosis not present

## 2021-11-13 DIAGNOSIS — L821 Other seborrheic keratosis: Secondary | ICD-10-CM | POA: Diagnosis not present

## 2021-11-13 DIAGNOSIS — D225 Melanocytic nevi of trunk: Secondary | ICD-10-CM | POA: Diagnosis not present

## 2021-11-13 DIAGNOSIS — Z85828 Personal history of other malignant neoplasm of skin: Secondary | ICD-10-CM | POA: Diagnosis not present

## 2021-11-13 DIAGNOSIS — D485 Neoplasm of uncertain behavior of skin: Secondary | ICD-10-CM | POA: Diagnosis not present

## 2021-11-13 DIAGNOSIS — C44519 Basal cell carcinoma of skin of other part of trunk: Secondary | ICD-10-CM | POA: Diagnosis not present

## 2021-11-13 DIAGNOSIS — D2272 Melanocytic nevi of left lower limb, including hip: Secondary | ICD-10-CM | POA: Diagnosis not present

## 2021-11-13 DIAGNOSIS — L570 Actinic keratosis: Secondary | ICD-10-CM | POA: Diagnosis not present

## 2021-11-13 DIAGNOSIS — L918 Other hypertrophic disorders of the skin: Secondary | ICD-10-CM | POA: Diagnosis not present

## 2021-11-13 DIAGNOSIS — D1801 Hemangioma of skin and subcutaneous tissue: Secondary | ICD-10-CM | POA: Diagnosis not present

## 2021-11-13 DIAGNOSIS — L814 Other melanin hyperpigmentation: Secondary | ICD-10-CM | POA: Diagnosis not present

## 2021-11-13 DIAGNOSIS — I788 Other diseases of capillaries: Secondary | ICD-10-CM | POA: Diagnosis not present

## 2021-11-28 ENCOUNTER — Encounter: Payer: Self-pay | Admitting: Nurse Practitioner

## 2021-11-28 ENCOUNTER — Telehealth: Payer: Self-pay

## 2021-11-28 ENCOUNTER — Ambulatory Visit (INDEPENDENT_AMBULATORY_CARE_PROVIDER_SITE_OTHER): Payer: PPO | Admitting: Nurse Practitioner

## 2021-11-28 ENCOUNTER — Other Ambulatory Visit: Payer: Self-pay

## 2021-11-28 DIAGNOSIS — Z0000 Encounter for general adult medical examination without abnormal findings: Secondary | ICD-10-CM | POA: Diagnosis not present

## 2021-11-28 NOTE — Progress Notes (Signed)
Subjective:   Mary Short is a 70 y.o. female who presents for Medicare Annual (Subsequent) preventive examination.  Review of Systems     Cardiac Risk Factors include: advanced age (>62men, >47 women);dyslipidemia;sedentary lifestyle     Objective:    Today's Vitals   11/28/21 1332  PainSc: 1    There is no height or weight on file to calculate BMI.  Advanced Directives 11/28/2021 06/09/2021 06/08/2020 11/18/2019 05/18/2019 11/19/2018 11/03/2018  Does Patient Have a Medical Advance Directive? No Yes Yes Yes No No No  Type of Advance Directive - Living will Living will Living will - - -  Does patient want to make changes to medical advance directive? - No - Patient declined No - Patient declined No - Patient declined No - Patient declined - -  Would patient like information on creating a medical advance directive? - - - - - No - Patient declined Yes (MAU/Ambulatory/Procedural Areas - Information given)    Current Medications (verified) Outpatient Encounter Medications as of 11/28/2021  Medication Sig   acetaminophen (TYLENOL) 500 MG tablet Take 1,000 mg by mouth as needed.   atorvastatin (LIPITOR) 20 MG tablet TAKE 1 TABLET BY MOUTH EVERY DAY   fluticasone (FLONASE) 50 MCG/ACT nasal spray Place 2 sprays into both nostrils at bedtime.   levocetirizine (XYZAL) 5 MG tablet Take 5 mg by mouth every evening.   Multiple Minerals-Vitamins (CITRACAL MAXIMUM PLUS) TABS Take 2 tablets by mouth daily at 12 noon.   Omega-3 Fatty Acids (FISH OIL PO) Take by mouth daily.   oxybutynin (DITROPAN-XL) 10 MG 24 hr tablet Take 10 mg by mouth as needed.   pantoprazole (PROTONIX) 40 MG tablet TAKE 1 TABLET BY MOUTH EVERY DAY   Probiotic Product (PROBIOTIC DAILY PO) Take by mouth. [employer] [facility] probiotic   tizanidine (ZANAFLEX) 2 MG capsule Take 1 capsule (2 mg total) by mouth 3 (three) times daily as needed for muscle spasms.   [DISCONTINUED] metoprolol tartrate (LOPRESSOR) 100 MG tablet Take 1 tablet  (100 mg total) by mouth once for 1 dose. Take 2 hours prior to your Cardiac CT.   No facility-administered encounter medications on file as of 11/28/2021.    Allergies (verified) Fish allergy, Penicillins, and Tape   History: Past Medical History:  Diagnosis Date   Hyperlipidemia    Osteoporosis    Seasonal allergies    Past Surgical History:  Procedure Laterality Date   BASAL CELL CARCINOMA EXCISION     hysterectomy     Dr. Bartolo Darter   WISDOM TOOTH EXTRACTION  1974   Family History  Problem Relation Age of Onset   Dementia Mother 75   Osteoporosis Mother    Heart failure Father        Deceased in 03/08/2021    Atrial fibrillation Father    Sick sinus syndrome Father    Heart disease Father    Diabetes Maternal Grandmother    Arthritis Maternal Grandfather    Dementia Maternal Grandfather    Cancer Paternal Grandmother    Heart attack Paternal Uncle    Stroke Paternal Aunt    Social History   Socioeconomic History   Marital status: Married    Spouse name: Not on file   Number of children: Not on file   Years of education: Not on file   Highest education level: Not on file  Occupational History   Not on file  Tobacco Use   Smoking status: Never   Smokeless tobacco: Never  Vaping Use   Vaping Use: Never used  Substance and Sexual Activity   Alcohol use: Yes    Comment: generally 1 drink per month   Drug use: Never   Sexual activity: Not Currently  Other Topics Concern   Not on file  Social History Narrative   Social History      Diet? Low carb/sugar      Do you drink/eat things with caffeine? Iced tea, chocolate      Marital status?   married          What year were you married? 1977      Do you live in a house, apartment, assisted living, condo, trailer, etc.? house      Is it one or more stories? 2      How many persons live in your home? 2      Do you have any pets in your home? (please list) 2 dogs      Highest level of education  completed? Masters degree      Current or past profession: Museum/gallery curator, Microbiologist      Do you exercise?         Very little                             Type & how often? Walk, some weights      Advanced Directives      Do you have a living will? no      Do you have a DNR form?    no                              If not, do you want to discuss one? No, not now      Do you have signed POA/HPOA for forms? no      Functional Status      Do you have difficulty bathing or dressing yourself? no      Do you have difficulty preparing food or eating? no      Do you have difficulty managing your medications? no      Do you have difficulty managing your finances? no      Do you have difficulty affording your medications? no       Social Determinants of Health   Financial Resource Strain: Not on file  Food Insecurity: Not on file  Transportation Needs: Not on file  Physical Activity: Not on file  Stress: Not on file  Social Connections: Not on file    Tobacco Counseling Counseling given: Not Answered   Clinical Intake:  Pre-visit preparation completed: Yes  Pain : 0-10 Pain Score: 1  Pain Location: Back Pain Orientation: Mid Pain Descriptors / Indicators: Aching Pain Onset: More than a month ago Pain Frequency: Intermittent     BMI - recorded: 28 Nutritional Risks: None Diabetes: No  How often do you need to have someone help you when you read instructions, pamphlets, or other written materials from your doctor or pharmacy?: 1 - Never  Diabetic?no         Activities of Daily Living In your present state of health, do you have any difficulty performing the following activities: 11/28/2021  Hearing? N  Vision? N  Difficulty concentrating or making decisions? N  Walking or climbing stairs? N  Dressing or bathing? N  Doing errands, shopping? N  Preparing Food  and eating ? N  Using the Toilet? N  In the past six months, have you accidently  leaked urine? Y  Do you have problems with loss of bowel control? N  Managing your Medications? N  Managing your Finances? N  Housekeeping or managing your Housekeeping? N  Some recent data might be hidden    Patient Care Team: Lauree Chandler, NP as PCP - General (Geriatric Medicine) Hale Bogus., MD as Referring Physician (Gastroenterology) Marylynn Pearson, MD as Consulting Physician (Obstetrics and Gynecology) Bjorn Loser, MD as Consulting Physician (Urology) Jacelyn Pi, MD as Consulting Physician (Endocrinology)  Indicate any recent Medical Services you may have received from other than [facility] providers in the past year (date may be approximate).     Assessment:   This is a routine wellness examination for Symiah.  Hearing/Vision screen Hearing Screening - Comments:: Patient has no hearing problems. Vision Screening - Comments:: Patient wears glasses. Patient had eye exam 6 months ago. Patient sees Dr. Isac Caddy  Dietary issues and exercise activities discussed: Current Exercise Habits: The patient does not participate in regular exercise at present   Goals Addressed   None    Depression Screen PHQ 2/9 Scores 11/28/2021 11/18/2019 11/09/2019 05/18/2019 10/06/2018  PHQ - 2 Score 0 0 0 0 0    Fall Risk Fall Risk  11/28/2021 06/09/2021 06/08/2020 11/18/2019 11/09/2019  Falls in the past year? 0 1 0 0 0  Number falls in past yr: 0 1 0 - 0  Injury with Fall? 0 0 0 0 0  Risk for fall due to : No Fall Risks History of fall(s) - - -  Follow up Falls evaluation completed - - - -    FALL RISK PREVENTION PERTAINING TO THE HOME:  Any stairs in or around the home? Yes  If so, are there any without handrails? No  Home free of loose throw rugs in walkways, pet beds, electrical cords, etc? Yes  Adequate lighting in your home to reduce risk of falls? Yes   ASSISTIVE DEVICES UTILIZED TO PREVENT FALLS:  Life alert? No  Use of a cane, walker or w/c? No  Grab bars in the  bathroom? No  Shower chair or bench in shower? Yes  Elevated toilet seat or a handicapped toilet? No   TIMED UP AND GO:  Was the test performed? No .    Cognitive Function: MMSE - Mini Mental State Exam 06/09/2021 11/09/2019 11/03/2018  Orientation to time 5 5 5   Orientation to Place 5 5 5   Registration 3 3 3   Attention/ Calculation 5 5 5   Recall 3 3 3   Language- name 2 objects 2 2 2   Language- repeat 1 1 1   Language- follow 3 step command 3 3 3   Language- read & follow direction 1 1 1   Write a sentence 1 1 1   Copy design 1 1 1   Total score 30 30 30      6CIT Screen 11/28/2021  What Year? 0 points  What month? 0 points  What time? 0 points  Count back from 20 0 points  Months in reverse 0 points  Repeat phrase 0 points  Total Score 0    Immunizations Immunization History  Administered Date(s) Administered   Influenza, High Dose Seasonal PF 10/06/2018, 10/01/2019   Influenza, Quadrivalent, Recombinant, Inj, Pf 09/08/2021   Influenza-Unspecified 09/30/2013, 10/29/2014, 12/17/2016, 08/31/2020   PFIZER Comirnaty(Gray Top)Covid-19 Tri-Sucrose Vaccine 07/25/2021   PFIZER(Purple Top)SARS-COV-2 Vaccination 02/07/2020, 03/04/2020, 09/30/2020   Pneumococcal Conjugate-13 11/03/2018  Pneumococcal Polysaccharide-23 11/09/2019   Pneumococcal-Unspecified 08/31/2020   Tdap 10/29/2014   Unspecified SARS-COV-2 Vaccination 07/25/2021   Zoster Recombinat (Shingrix) 10/10/2018, 12/09/2018   Zoster, Live 08/31/2020    TDAP status: Up to date  Flu Vaccine status: Up to date  Pneumococcal vaccine status: Up to date  Covid-19 vaccine status: Information provided on how to obtain vaccines.   Qualifies for Shingles Vaccine? Yes   Zostavax completed Yes   Shingrix Completed?: Yes  Screening Tests Health Maintenance  Topic Date Due   COVID-19 Vaccine (5 - Booster for Pfizer series) 09/19/2021   MAMMOGRAM  08/12/2023   COLONOSCOPY (Pts 45-55yrs Insurance coverage will need to be  confirmed)  10/09/2023   TETANUS/TDAP  10/29/2024   Pneumonia Vaccine 11+ Years old  Completed   INFLUENZA VACCINE  Completed   DEXA SCAN  Completed   Hepatitis C Screening  Completed   Zoster Vaccines- Shingrix  Completed   HPV VACCINES  Aged Out    Health Maintenance  Health Maintenance Due  Topic Date Due   COVID-19 Vaccine (5 - Booster for Wallace series) 09/19/2021    Colorectal cancer screening: Type of screening: Colonoscopy. Completed 2019. Repeat every 10 years  Mammogram status: Completed 2022. Repeat every year  Bone Density status: Completed 2021. Results reflect: Bone density results: OSTEOPENIA. Repeat every 2 years.  Lung Cancer Screening: (Low Dose CT Chest recommended if Age 49-80 years, 30 pack-year currently smoking OR have quit w/in 15years.) does not qualify.   Lung Cancer Screening Referral: na  Additional Screening:  Hepatitis C Screening: does qualify; Completed   Vision Screening: Recommended annual ophthalmology exams for early detection of glaucoma and other disorders of the eye. Is the patient up to date with their annual eye exam?  Yes  Who is the provider or what is the name of the office in which the patient attends annual eye exams? [place] If pt is not established with a provider, would they like to be referred to a provider to establish care? No .   Dental Screening: Recommended annual dental exams for proper oral hygiene  Community Resource Referral / Chronic Care Management: CRR required this visit?  No   CCM required this visit?  No      Plan:     I have personally reviewed and noted the following in the patient's chart:   Medical and social history Use of alcohol, tobacco or illicit drugs  Current medications and supplements including opioid prescriptions.  Functional ability and status Nutritional status Physical activity Advanced directives List of other physicians Hospitalizations, surgeries, and ER visits in previous  12 months Vitals Screenings to include cognitive, depression, and falls Referrals and appointments  In addition, I have reviewed and discussed with patient certain preventive protocols, quality metrics, and best practice recommendations. A written personalized care plan for preventive services as well as general preventive health recommendations were provided to patient.     Lauree Chandler, NP   11/28/2021    Virtual Visit via Telephone Note  I connected withNAME@ on 11/28/21 at  1:00 PM EST by telephone and verified that I am speaking with the correct person using two identifiers.  Location: Patient: home  Provider: [facility]   I discussed the limitations, risks, security and privacy concerns of performing an evaluation and management service by telephone and the availability of in person appointments. I also discussed with the patient that there may be a patient responsible charge related to this service. The patient expressed  understanding and agreed to proceed.   I discussed the assessment and treatment plan with the patient. The patient was provided an opportunity to ask questions and all were answered. The patient agreed with the plan and demonstrated an understanding of the instructions.   The patient was advised to call back or seek an in-person evaluation if the symptoms worsen or if the condition fails to improve as anticipated.  I provided 15 minutes of non-face-to-face time during this encounter.  Carlos American. Harle Battiest Avs printed and mailed

## 2021-11-28 NOTE — Progress Notes (Signed)
This service is provided via telemedicine  No vital signs collected/recorded due to the encounter was a telemedicine visit.   Location of patient (ex: home, work):  Home  Patient consents to a telephone visit:  Yes, see encounter dated 11/28/2021  Location of the provider (ex: office, home):  [place]  Name of any referring provider:  N/A  Names of all persons participating in the telemedicine service and their role in the encounter:  [name], Nurse Practitioner, Carroll Kinds, CMA, and patient.   Time spent on call:  8 minutes with medical assistant

## 2021-11-28 NOTE — Telephone Encounter (Signed)
Ms. aleiya, rye are scheduled for a virtual visit with your provider today.    Just as we do with appointments in the office, we must obtain your consent to participate.  Your consent will be active for this visit and any virtual visit you may have with one of our providers in the next 365 days.    If you have a MyChart account, I can also send a copy of this consent to you electronically.  All virtual visits are billed to your insurance company just like a traditional visit in the office.  As this is a virtual visit, video technology does not allow for your provider to perform a traditional examination.  This may limit your provider's ability to fully assess your condition.  If your provider identifies any concerns that need to be evaluated in person or the need to arrange testing such as labs, EKG, etc, we will make arrangements to do so.    Although advances in technology are sophisticated, we cannot ensure that it will always work on either your end or our end.  If the connection with a video visit is poor, we may have to switch to a telephone visit.  With either a video or telephone visit, we are not always able to ensure that we have a secure connection.   I need to obtain your verbal consent now.   Are you willing to proceed with your visit today?   Felicha Frayne has provided verbal consent on 11/28/2021 for a virtual visit (video or telephone).   Carroll Kinds, CMA 11/28/2021  1:23 PM

## 2021-11-28 NOTE — Patient Instructions (Signed)
Mary Short , Thank you for taking time to come for your Medicare Wellness Visit. I appreciate your ongoing commitment to your health goals. Please review the following plan we discussed and let me know if I can assist you in the future.   Screening recommendations/referrals: Colonoscopy up to date Mammogram up to date Bone Density up to date Recommended yearly ophthalmology/optometry visit for glaucoma screening and checkup Recommended yearly dental visit for hygiene and checkup  Vaccinations: Influenza vaccine up to date Pneumococcal vaccine up to date Tdap vaccine up to date Shingles vaccine up to date    Advanced directives: recommended to place on file.   Conditions/risks identified: advance age, sedentary lifestyle.   Next appointment: yearly    Preventive Care 49 Years and Older, Female Preventive care refers to lifestyle choices and visits with your health care provider that can promote health and wellness. What does preventive care include? A yearly physical exam. This is also called an annual well check. Dental exams once or twice a year. Routine eye exams. Ask your health care provider how often you should have your eyes checked. Personal lifestyle choices, including: Daily care of your teeth and gums. Regular physical activity. Eating a healthy diet. Avoiding tobacco and drug use. Limiting alcohol use. Practicing safe sex. Taking low-dose aspirin every day. Taking vitamin and mineral supplements as recommended by your health care provider. What happens during an annual well check? The services and screenings done by your health care provider during your annual well check will depend on your age, overall health, lifestyle risk factors, and family history of disease. Counseling  Your health care provider may ask you questions about your: Alcohol use. Tobacco use. Drug use. Emotional well-being. Home and relationship well-being. Sexual activity. Eating  habits. History of falls. Memory and ability to understand (cognition). Work and work Statistician. Reproductive health. Screening  You may have the following tests or measurements: Height, weight, and BMI. Blood pressure. Lipid and cholesterol levels. These may be checked every 5 years, or more frequently if you are over 52 years old. Skin check. Lung cancer screening. You may have this screening every year starting at age 71 if you have a 30-pack-year history of smoking and currently smoke or have quit within the past 15 years. Fecal occult blood test (FOBT) of the stool. You may have this test every year starting at age 3. Flexible sigmoidoscopy or colonoscopy. You may have a sigmoidoscopy every 5 years or a colonoscopy every 10 years starting at age 43. Hepatitis C blood test. Hepatitis B blood test. Sexually transmitted disease (STD) testing. Diabetes screening. This is done by checking your blood sugar (glucose) after you have not eaten for a while (fasting). You may have this done every 1-3 years. Bone density scan. This is done to screen for osteoporosis. You may have this done starting at age 26. Mammogram. This may be done every 1-2 years. Talk to your health care provider about how often you should have regular mammograms. Talk with your health care provider about your test results, treatment options, and if necessary, the need for more tests. Vaccines  Your health care provider may recommend certain vaccines, such as: Influenza vaccine. This is recommended every year. Tetanus, diphtheria, and acellular pertussis (Tdap, Td) vaccine. You may need a Td booster every 10 years. Zoster vaccine. You may need this after age 22. Pneumococcal 13-valent conjugate (PCV13) vaccine. One dose is recommended after age 79. Pneumococcal polysaccharide (PPSV23) vaccine. One dose is recommended after age  87. Talk to your health care provider about which screenings and vaccines you need and how  often you need them. This information is not intended to replace advice given to you by your health care provider. Make sure you discuss any questions you have with your health care provider. Document Released: 01/13/2016 Document Revised: 09/05/2016 Document Reviewed: 10/18/2015 Elsevier Interactive Patient Education  2017 [place] Prevention in the Home Falls can cause injuries. They can happen to people of all ages. There are many things you can do to make your home safe and to help prevent falls. What can I do on the outside of my home? Regularly fix the edges of walkways and driveways and fix any cracks. Remove anything that might make you trip as you walk through a door, such as a raised step or threshold. Trim any bushes or trees on the path to your home. Use bright outdoor lighting. Clear any walking paths of anything that might make someone trip, such as rocks or tools. Regularly check to see if handrails are loose or broken. Make sure that both sides of any steps have handrails. Any raised decks and porches should have guardrails on the edges. Have any leaves, snow, or ice cleared regularly. Use sand or salt on walking paths during winter. Clean up any spills in your garage right away. This includes oil or grease spills. What can I do in the bathroom? Use night lights. Install grab bars by the toilet and in the tub and shower. Do not use towel bars as grab bars. Use non-skid mats or decals in the tub or shower. If you need to sit down in the shower, use a plastic, non-slip stool. Keep the floor dry. Clean up any water that spills on the floor as soon as it happens. Remove soap buildup in the tub or shower regularly. Attach bath mats securely with double-sided non-slip rug tape. Do not have throw rugs and other things on the floor that can make you trip. What can I do in the bedroom? Use night lights. Make sure that you have a light by your bed that is easy to  reach. Do not use any sheets or blankets that are too big for your bed. They should not hang down onto the floor. Have a firm chair that has side arms. You can use this for support while you get dressed. Do not have throw rugs and other things on the floor that can make you trip. What can I do in the kitchen? Clean up any spills right away. Avoid walking on wet floors. Keep items that you use a lot in easy-to-reach places. If you need to reach something above you, use a strong step stool that has a grab bar. Keep electrical cords out of the way. Do not use floor polish or wax that makes floors slippery. If you must use wax, use non-skid floor wax. Do not have throw rugs and other things on the floor that can make you trip. What can I do with my stairs? Do not leave any items on the stairs. Make sure that there are handrails on both sides of the stairs and use them. Fix handrails that are broken or loose. Make sure that handrails are as long as the stairways. Check any carpeting to make sure that it is firmly attached to the stairs. Fix any carpet that is loose or worn. Avoid having throw rugs at the top or bottom of the stairs. If you do have  throw rugs, attach them to the floor with carpet tape. Make sure that you have a light switch at the top of the stairs and the bottom of the stairs. If you do not have them, ask someone to add them for you. What else can I do to help prevent falls? Wear shoes that: Do not have high heels. Have rubber bottoms. Are comfortable and fit you well. Are closed at the toe. Do not wear sandals. If you use a stepladder: Make sure that it is fully opened. Do not climb a closed stepladder. Make sure that both sides of the stepladder are locked into place. Ask someone to hold it for you, if possible. Clearly mark and make sure that you can see: Any grab bars or handrails. First and last steps. Where the edge of each step is. Use tools that help you move  around (mobility aids) if they are needed. These include: Canes. Walkers. Scooters. Crutches. Turn on the lights when you go into a dark area. Replace any light bulbs as soon as they burn out. Set up your furniture so you have a clear path. Avoid moving your furniture around. If any of your floors are uneven, fix them. If there are any pets around you, be aware of where they are. Review your medicines with your doctor. Some medicines can make you feel dizzy. This can increase your chance of falling. Ask your doctor what other things that you can do to help prevent falls. This information is not intended to replace advice given to you by your health care provider. Make sure you discuss any questions you have with your health care provider. Document Released: 10/13/2009 Document Revised: 05/24/2016 Document Reviewed: 01/21/2015 Elsevier Interactive Patient Education  2017 [employer].

## 2021-12-26 DIAGNOSIS — C44519 Basal cell carcinoma of skin of other part of trunk: Secondary | ICD-10-CM | POA: Diagnosis not present

## 2022-01-11 ENCOUNTER — Ambulatory Visit: Payer: PPO | Admitting: Cardiology

## 2022-02-11 ENCOUNTER — Other Ambulatory Visit: Payer: Self-pay | Admitting: Nurse Practitioner

## 2022-02-11 DIAGNOSIS — E782 Mixed hyperlipidemia: Secondary | ICD-10-CM

## 2022-04-03 NOTE — Progress Notes (Deleted)
?Cardiology Office Note:   ? ?Date:  04/06/2022  ? ?ID:  Mary Short, DOB May 31, 1951, MRN [id number] ? ?PCP:  Lauree Chandler, NP ?  ?[facility] Providers ?Cardiologist:  None { ? ?Referring MD: Lauree Chandler, NP  ? ? ?History of Present Illness:   ? ?Mary Short is a 71 y.o. female with a hx of HLD and family history of CAD who presents to clinic for follow-up. ? ?Was last seen in clinic in 06/2021 where she was having dyspnea on exertion. Coronary CTA 07/2021 with no evidence of disease. Ca score 0. TTE 07/2021 with LVEF >75%, normal RV, trivial MR, GLS -22.2%. ? ?Today, *** ? ?Father with MI, carotid artery disease, and CHF (died at age 25). Daughter with bicuspid aortic valve and coarctation. ? ?Past Medical History:  ?Diagnosis Date  ? Hyperlipidemia   ? Osteoporosis   ? Seasonal allergies   ? ? ?Past Surgical History:  ?Procedure Laterality Date  ? BASAL CELL CARCINOMA EXCISION    ? hysterectomy    ? Dr. Bartolo Darter  ? [place]  ? ? ?Current Medications: ?Current Meds  ?Medication Sig  ? acetaminophen (TYLENOL) 500 MG tablet Take 1,000 mg by mouth as needed.  ? atorvastatin (LIPITOR) 20 MG tablet TAKE 1 TABLET BY MOUTH EVERY DAY  ? fluticasone (FLONASE) 50 MCG/ACT nasal spray Place 2 sprays into both nostrils at bedtime.  ? levocetirizine (XYZAL) 5 MG tablet Take 5 mg by mouth every evening.  ? Multiple Minerals-Vitamins (CITRACAL MAXIMUM PLUS) TABS Take 2 tablets by mouth daily at 12 noon.  ? Omega-3 Fatty Acids (FISH OIL PO) Take by mouth daily.  ? oxybutynin (DITROPAN-XL) 10 MG 24 hr tablet Take 10 mg by mouth as needed.  ? pantoprazole (PROTONIX) 40 MG tablet Take 40 mg by mouth as needed.  ? Probiotic Product (PROBIOTIC DAILY PO) Take by mouth. [employer] probiotic  ? tizanidine (ZANAFLEX) 2 MG capsule Take 1 capsule (2 mg total) by mouth 3 (three) times daily as needed for muscle spasms.  ?  ? ?Allergies:   Fish allergy, Penicillins, and Tape  ? ?Social History   ? ?Socioeconomic History  ? Marital status: Married  ?  Spouse name: Not on file  ? Number of children: Not on file  ? Years of education: Not on file  ? Highest education level: Not on file  ?Occupational History  ? Not on file  ?Tobacco Use  ? Smoking status: Never  ? Smokeless tobacco: Never  ?Vaping Use  ? Vaping Use: Never used  ?Substance and Sexual Activity  ? Alcohol use: Yes  ?  Comment: generally 1 drink per month  ? Drug use: Never  ? Sexual activity: Not Currently  ?Other Topics Concern  ? Not on file  ?Social History Narrative  ? Social History  ?   ? Diet? Low carb/sugar  ?   ? Do you drink/eat things with caffeine? Iced tea, chocolate  ?   ? Marital status?   married          What year were you married? 1977  ?   ? Do you live in a house, apartment, assisted living, condo, trailer, etc.? house  ?   ? Is it one or more stories? 2  ?   ? How many persons live in your home? 2  ?   ? Do you have any pets in your home? (please list) 2 dogs  ?   ?  Highest level of education completed? Masters degree  ?   ? Current or past profession: Museum/gallery curator, Microbiologist  ?   ? Do you exercise?         Very little                             Type & how often? Walk, some weights  ?   ? Advanced Directives  ?   ? Do you have a living will? no  ?   ? Do you have a DNR form?    no                              If not, do you want to discuss one? No, not now  ?   ? Do you have signed POA/HPOA for forms? no  ?   ? Functional Status  ?   ? Do you have difficulty bathing or dressing yourself? no  ?   ? Do you have difficulty preparing food or eating? no  ?   ? Do you have difficulty managing your medications? no  ?   ? Do you have difficulty managing your finances? no  ?   ? Do you have difficulty affording your medications? no  ?    ? ?Social Determinants of Health  ? ?Financial Resource Strain: Not on file  ?Food Insecurity: Not on file  ?Transportation Needs: Not on file  ?Physical Activity: Not on file   ?Stress: Not on file  ?Social Connections: Not on file  ?  ? ?Family History: ?The patient's family history includes Arthritis in her maternal grandfather; Atrial fibrillation in her father; Cancer in her paternal grandmother; Dementia in her maternal grandfather; Dementia (age of onset: 38) in her mother; Diabetes in her maternal grandmother; Heart attack in her paternal uncle; Heart disease in her father; Heart failure in her father; Osteoporosis in her mother; Sick sinus syndrome in her father; Stroke in her paternal aunt. ? ?ROS:   ?Please see the history of present illness.    ?Review of Systems  ?Constitutional:  Negative for chills and fever.  ?HENT:  Negative for sore throat.   ?Eyes:  Negative for blurred vision.  ?Respiratory:  Positive for shortness of breath.   ?Cardiovascular:  Positive for palpitations. Negative for chest pain, orthopnea, claudication, leg swelling and PND.  ?Gastrointestinal:  Negative for nausea and vomiting.  ?Genitourinary:  Negative for flank pain.  ?Musculoskeletal:  Negative for falls.  ?Neurological:  Negative for dizziness and loss of consciousness.  ?Psychiatric/Behavioral:  Negative for substance abuse.    ? ?EKGs/Labs/Other Studies Reviewed:   ? ?The following studies were reviewed today: ?Coronary CTA 07/2021: ?FINDINGS: ?Non-cardiac: See separate report from [facility]. No ?significant findings on limited lung and soft tissue windows. ?  ?Calcium score: No calcium noted ?  ?Coronary Arteries: Right dominant with no anomalies ?  ?LM: Normal ?  ?LAD: Normal ?  ?D1: Normal ?  ?D2: Normal ?  ?Circumflex: Normal ?  ?OM1: Normal ?  ?OM2: Normal ?  ?RCA: Normal ?  ?PDA: Normal ?  ?PLA: Normal ?  ?IMPRESSION: ?1. Normal aortic root diameter 3.5 cm ?  ?2.  Calcium Score 0 ?  ?3.  CAD RADS 0 Normal right dominant coronary arteries ? ?TTE 07/2021: ?IMPRESSIONS  ? ? ? 1. Global longitudinal strain is -22.2%. Left ventricular ejection  ?  fraction, by estimation, is >75%. The  left ventricle has hyperdynamic  ?function. The left ventricle has no regional wall motion abnormalities.  ?Left ventricular diastolic parameters are  ?indeterminate.  ? 2. Right ventricular systolic function is normal. The right ventricular  ?size is normal.  ? 3. The mitral valve is normal in structure. Trivial mitral valve  ?regurgitation.  ? 4. The aortic valve is normal in structure. Aortic valve regurgitation is  ?not visualized.  ? 5. The inferior vena cava is normal in size with greater than 50%  ?respiratory variability, suggesting right atrial pressure of 3 mmHg.  ? ?EKG:  No new tracings ? ?Recent Labs: ?06/06/2021: ALT 31; Hemoglobin 14.4; Platelets 245 ?08/14/2021: BUN 20; Creatinine, Ser 0.77; Potassium 3.8; Sodium 144  ?Recent Lipid Panel ?   ?Component Value Date/Time  ? CHOL 181 06/06/2021 1010  ? TRIG 85 06/06/2021 1010  ? HDL 83 06/06/2021 1010  ? CHOLHDL 2.2 06/06/2021 1010  ? [place] 81 06/06/2021 1010  ? ? ? ?Risk Assessment/Calculations:   ?  ? ?    ? ?Physical Exam:   ? ?VS:  BP 124/80   Pulse 75   Ht 5' 5.5" (1.664 m)   Wt 175 lb (79.4 kg)   SpO2 96%   BMI 28.68 kg/m?    ? ?Wt Readings from Last 3 Encounters:  ?04/06/22 175 lb (79.4 kg)  ?07/18/21 176 lb (79.8 kg)  ?06/09/21 171 lb (77.6 kg)  ?  ? ?GEN:  Well nourished, well developed in no acute distress ?HEENT: Normal ?NECK: No JVD; No carotid bruits ?CARDIAC: RRR, 1/6 systolic murmur. No rubs, gallops ?RESPIRATORY:  Clear to auscultation without rales, wheezing or rhonchi  ?ABDOMEN: Soft, non-tender, non-distended ?MUSCULOSKELETAL:  No edema; No deformity  ?SKIN: Warm and dry ?NEUROLOGIC:  Alert and oriented x 3 ?PSYCHIATRIC:  Normal affect  ? ?ASSESSMENT:   ? ?No diagnosis found. ? ?PLAN:   ? ?In order of problems listed above: ? ?#SOB: ?Reassuring work-up with normal coronary CTA and Ca score 0. TTE with normal BiV function and no significant valve disease. ? ?#Family history of Coarctation and BiV: ?Patient's daughter with  coarctation of aorta and bicsupid valve. Patient with normal tricuspid valve. ? ?#HLD: ?-Continue lipitor '20mg'$  daily ? ?#Elevated blood pressure: ? ?Medication Adjustments/Labs and Tests Ordered: ?Current medicines are revi

## 2022-04-06 ENCOUNTER — Ambulatory Visit: Payer: PPO | Admitting: Cardiology

## 2022-04-06 ENCOUNTER — Encounter: Payer: Self-pay | Admitting: Cardiology

## 2022-04-06 VITALS — BP 124/80 | HR 75 | Ht 65.5 in | Wt 175.0 lb

## 2022-04-06 DIAGNOSIS — R0602 Shortness of breath: Secondary | ICD-10-CM | POA: Diagnosis not present

## 2022-04-06 DIAGNOSIS — Z8279 Family history of other congenital malformations, deformations and chromosomal abnormalities: Secondary | ICD-10-CM | POA: Diagnosis not present

## 2022-04-06 DIAGNOSIS — E782 Mixed hyperlipidemia: Secondary | ICD-10-CM | POA: Diagnosis not present

## 2022-04-06 NOTE — Progress Notes (Signed)
?Cardiology Office Note:   ? ?Date:  04/06/2022  ? ?ID:  Mary Short, DOB 1951/06/24, MRN [id number] ? ?PCP:  Lauree Chandler, NP ?  ?[facility] Providers ?Cardiologist:  None { ? ?Referring MD: Lauree Chandler, NP  ? ? ?History of Present Illness:   ? ?Mary Short is a 71 y.o. female with a hx of HLD and family history of CAD who presents to clinic for follow-up. ? ?Was last seen in clinic in 06/2021 where she was having dyspnea on exertion. Coronary CTA 07/2021 with no evidence of disease. Ca score 0. TTE 07/2021 with LVEF >75%, normal RV, trivial MR, GLS -22.2%. ? ?Today, she reports she is doing well. ?Her breathing is better. She started exercising 2 days a week with yoga and aerobics in the past month. Denies exertional symptoms.  ?She does not monitor her blood pressure at home but it is well-controlled at today's visit. ?BP Readings from Last 3 Encounters:  ?04/06/22 124/80  ?08/16/21 116/89  ?07/18/21 140/80  ? ?Cholesterol levels are well-controlled.   ? ?She denies chest pain, shortness of breath, palpitations, lightheadedness, headaches, syncope, LE edema, orthopnea, PND.  ? ?Father with MI, carotid artery disease, and CHF (died at age 47). Daughter with bicuspid aortic valve and coarctation. ? ?Past Medical History:  ?Diagnosis Date  ? Hyperlipidemia   ? Osteoporosis   ? Seasonal allergies   ? ? ?Past Surgical History:  ?Procedure Laterality Date  ? BASAL CELL CARCINOMA EXCISION    ? hysterectomy    ? Dr. Bartolo Darter  ? [place]  ? ? ?Current Medications: ?Current Meds  ?Medication Sig  ? acetaminophen (TYLENOL) 500 MG tablet Take 1,000 mg by mouth as needed.  ? atorvastatin (LIPITOR) 20 MG tablet TAKE 1 TABLET BY MOUTH EVERY DAY  ? fluticasone (FLONASE) 50 MCG/ACT nasal spray Place 2 sprays into both nostrils at bedtime.  ? levocetirizine (XYZAL) 5 MG tablet Take 5 mg by mouth every evening.  ? Multiple Minerals-Vitamins (CITRACAL MAXIMUM PLUS) TABS Take 2 tablets  by mouth daily at 12 noon.  ? Omega-3 Fatty Acids (FISH OIL PO) Take by mouth daily.  ? oxybutynin (DITROPAN-XL) 10 MG 24 hr tablet Take 10 mg by mouth as needed.  ? pantoprazole (PROTONIX) 40 MG tablet Take 40 mg by mouth as needed.  ? Probiotic Product (PROBIOTIC DAILY PO) Take by mouth. [employer] probiotic  ? tizanidine (ZANAFLEX) 2 MG capsule Take 1 capsule (2 mg total) by mouth 3 (three) times daily as needed for muscle spasms.  ?  ? ?Allergies:   Fish allergy, Penicillins, and Tape  ? ?Social History  ? ?Socioeconomic History  ? Marital status: Married  ?  Spouse name: Not on file  ? Number of children: Not on file  ? Years of education: Not on file  ? Highest education level: Not on file  ?Occupational History  ? Not on file  ?Tobacco Use  ? Smoking status: Never  ? Smokeless tobacco: Never  ?Vaping Use  ? Vaping Use: Never used  ?Substance and Sexual Activity  ? Alcohol use: Yes  ?  Comment: generally 1 drink per month  ? Drug use: Never  ? Sexual activity: Not Currently  ?Other Topics Concern  ? Not on file  ?Social History Narrative  ? Social History  ?   ? Diet? Low carb/sugar  ?   ? Do you drink/eat things with caffeine? Iced tea, chocolate  ?   ?  Marital status?   married          What year were you married? 1977  ?   ? Do you live in a house, apartment, assisted living, condo, trailer, etc.? house  ?   ? Is it one or more stories? 2  ?   ? How many persons live in your home? 2  ?   ? Do you have any pets in your home? (please list) 2 dogs  ?   ? Highest level of education completed? Masters degree  ?   ? Current or past profession: Museum/gallery curator, Microbiologist  ?   ? Do you exercise?         Very little                             Type & how often? Walk, some weights  ?   ? Advanced Directives  ?   ? Do you have a living will? no  ?   ? Do you have a DNR form?    no                              If not, do you want to discuss one? No, not now  ?   ? Do you have signed POA/HPOA for forms? no   ?   ? Functional Status  ?   ? Do you have difficulty bathing or dressing yourself? no  ?   ? Do you have difficulty preparing food or eating? no  ?   ? Do you have difficulty managing your medications? no  ?   ? Do you have difficulty managing your finances? no  ?   ? Do you have difficulty affording your medications? no  ?    ? ?Social Determinants of Health  ? ?Financial Resource Strain: Not on file  ?Food Insecurity: Not on file  ?Transportation Needs: Not on file  ?Physical Activity: Not on file  ?Stress: Not on file  ?Social Connections: Not on file  ?  ? ?Family History: ?The patient's family history includes Arthritis in her maternal grandfather; Atrial fibrillation in her father; Cancer in her paternal grandmother; Dementia in her maternal grandfather; Dementia (age of onset: 11) in her mother; Diabetes in her maternal grandmother; Heart attack in her paternal uncle; Heart disease in her father; Heart failure in her father; Osteoporosis in her mother; Sick sinus syndrome in her father; Stroke in her paternal aunt. ? ?ROS:   ?Please see the history of present illness.    ?Review of Systems  ?Constitutional:  Negative for chills and fever.  ?HENT:  Negative for sore throat.   ?Eyes:  Negative for blurred vision.  ?Cardiovascular:  Negative for chest pain, orthopnea, claudication, leg swelling and PND.  ?Gastrointestinal:  Negative for nausea and vomiting.  ?Genitourinary:  Negative for flank pain.  ?Musculoskeletal:  Negative for falls.  ?Neurological:  Negative for dizziness and loss of consciousness.  ?Psychiatric/Behavioral:  Negative for substance abuse.    ? ?EKGs/Labs/Other Studies Reviewed:   ? ?The following studies were reviewed today: ?Coronary CTA 07/2021: ?FINDINGS: ?Non-cardiac: See separate report from [facility]. No ?significant findings on limited lung and soft tissue windows. ?  ?Calcium score: No calcium noted ?  ?Coronary Arteries: Right dominant with no anomalies ?  ?LM:  Normal ?  ?LAD: Normal ?  ?D1: Normal ?  ?  D2: Normal ?  ?Circumflex: Normal ?  ?OM1: Normal ?  ?OM2: Normal ?  ?RCA: Normal ?  ?PDA: Normal ?  ?PLA: Normal ?  ?IMPRESSION: ?1. Normal aortic root diameter 3.5 cm ?  ?2.  Calcium Score 0 ?  ?3.  CAD RADS 0 Normal right dominant coronary arteries ? ?TTE 07/2021: ?IMPRESSIONS  ? ? ? 1. Global longitudinal strain is -22.2%. Left ventricular ejection fraction, by estimation, is >75%. The left ventricle has hyperdynamic function. The left ventricle has no regional wall motion abnormalities.  ?Left ventricular diastolic parameters are indeterminate.  ? 2. Right ventricular systolic function is normal. The right ventricular size is normal.  ? 3. The mitral valve is normal in structure. Trivial mitral valve regurgitation.  ? 4. The aortic valve is normal in structure. Aortic valve regurgitation is not visualized.  ? 5. The inferior vena cava is normal in size with greater than 50% respiratory variability, suggesting right atrial pressure of 3 mmHg.  ? ?EKG:   ?04/06/22: no ekg done today. ?No new tracings ? ?Recent Labs: ?06/06/2021: ALT 31; Hemoglobin 14.4; Platelets 245 ?08/14/2021: BUN 20; Creatinine, Ser 0.77; Potassium 3.8; Sodium 144  ?Recent Lipid Panel ?   ?Component Value Date/Time  ? CHOL 181 06/06/2021 1010  ? TRIG 85 06/06/2021 1010  ? HDL 83 06/06/2021 1010  ? CHOLHDL 2.2 06/06/2021 1010  ? [place] 81 06/06/2021 1010  ? ? ? ?Risk Assessment/Calculations:   ?  ? ?    ? ?Physical Exam:   ? ?VS:  BP 124/80   Pulse 75   Ht 5' 5.5" (1.664 m)   Wt 175 lb (79.4 kg)   SpO2 96%   BMI 28.68 kg/m?    ? ?Wt Readings from Last 3 Encounters:  ?04/06/22 175 lb (79.4 kg)  ?07/18/21 176 lb (79.8 kg)  ?06/09/21 171 lb (77.6 kg)  ?  ? ?GEN:  Well nourished, well developed in no acute distress ?HEENT: Normal ?NECK: No JVD; No carotid bruits ?CARDIAC: RRR, 1/6 systolic murmur. No rubs, gallops ?RESPIRATORY:  Clear to auscultation without rales, wheezing or rhonchi  ?ABDOMEN: Soft,  non-tender, non-distended ?MUSCULOSKELETAL:  No edema; No deformity  ?SKIN: Warm and dry ?NEUROLOGIC:  Alert and oriented x 3 ?PSYCHIATRIC:  Normal affect  ? ?ASSESSMENT:   ? ?1. SOB (shortness of breath)   ?2. Mixe

## 2022-04-06 NOTE — Patient Instructions (Signed)
Medication Instructions:  ? ?Your physician recommends that you continue on your current medications as directed. Please refer to the Current Medication list given to you today. ? ?*If you need a refill on your cardiac medications before your next appointment, please call your pharmacy* ? ? ?Follow-Up: ?At [facility], you and your health needs are our priority.  As part of our continuing mission to provide you with exceptional heart care, we have created designated Provider Care Teams.  These Care Teams include your primary Cardiologist (physician) and Advanced Practice Providers (APPs -  Physician Assistants and Nurse Practitioners) who all work together to provide you with the care you need, when you need it. ? ?We recommend signing up for the patient portal called "MyChart".  Sign up information is provided on this After Visit Summary.  MyChart is used to connect with patients for Virtual Visits (Telemedicine).  Patients are able to view lab/test results, encounter notes, upcoming appointments, etc.  Non-urgent messages can be sent to your provider as well.   ?To learn more about what you can do with MyChart, go to [email or web address].   ? ?Your next appointment:   ?2 year(s) ? ?The format for your next appointment:   ?In Person ? ?Provider:   ?Dr. Johney Frame  ? ?

## 2022-04-23 ENCOUNTER — Encounter: Payer: Self-pay | Admitting: Nurse Practitioner

## 2022-04-23 ENCOUNTER — Ambulatory Visit
Admission: RE | Admit: 2022-04-23 | Discharge: 2022-04-23 | Disposition: A | Discharge status: Home / Self Care | Payer: PPO | Source: Ambulatory Visit | Attending: Nurse Practitioner | Admitting: Nurse Practitioner

## 2022-04-23 ENCOUNTER — Ambulatory Visit (INDEPENDENT_AMBULATORY_CARE_PROVIDER_SITE_OTHER): Payer: PPO | Admitting: Nurse Practitioner

## 2022-04-23 VITALS — BP 132/82 | HR 82 | Temp 97.4°F | Resp 16 | Ht 65.5 in | Wt 178.0 lb

## 2022-04-23 DIAGNOSIS — M542 Cervicalgia: Secondary | ICD-10-CM

## 2022-04-23 DIAGNOSIS — N3281 Overactive bladder: Secondary | ICD-10-CM | POA: Diagnosis not present

## 2022-04-23 DIAGNOSIS — R200 Anesthesia of skin: Secondary | ICD-10-CM | POA: Diagnosis not present

## 2022-04-23 MED ORDER — PREDNISONE 10 MG (21) PO TBPK: ORAL_TABLET | ORAL | 0 refills | Status: DC

## 2022-04-23 MED ORDER — TOLTERODINE TARTRATE ER 2 MG PO CP24: 2.0000 mg | ORAL_CAPSULE | Freq: Every day | ORAL | 1 refills | Status: DC

## 2022-04-23 NOTE — Progress Notes (Signed)
? ? ?Careteam: ?Patient Care Team: ?Lauree Chandler, NP as PCP - General (Geriatric Medicine) ?Hale Bogus., MD as Referring Physician (Gastroenterology) ?Marylynn Pearson, MD as Consulting Physician (Obstetrics and Gynecology) ?Bjorn Loser, MD as Consulting Physician (Urology) ?Jacelyn Pi, MD as Consulting Physician (Endocrinology) ? ?PLACE OF SERVICE:  ?[facility]  ?Advanced Directive information ?Does Patient Have a Medical Advance Directive?: No, Would patient like information on creating a medical advance directive?: No - Patient declined ? ?Allergies  ?Allergen Reactions  ? Fish Allergy   ?  Any fish in the flounder family causes rash, difficulty breathing, swelling of eyes, itching, water/swelling under eyes  ? Penicillins   ?  Swelling of the lips and a rash  ? Tape   ?  Localized rash  ? ? ?Chief Complaint  ?Patient presents with  ? Acute Visit  ?  Patient complains of stiff neck on right side. Possible pinched nerve.  ? ? ? ?HPI: Patient is a 71 y.o. female due to neck pain ?She has had neck pain since December.  ?She says now it is radiating down right arm and causing tingling.  ?Taking tylenol and this has not been helping.  ?She is laying flat on the couch to sleep.  ?She has gotten a pillow to help but it made it worse.  ?Has seen ortho in the past for this.  ?Limited ROM from side to side.  ?When she extends her neck pain shots to her arm. Feels like her arm is bruised.  ?Tingling is not continuous but if she is leaning over then it will start.  ?Pain as high as a 6 or 7/10  ?She had to sit down because she felt sick due to her pain.  ?Pain is staying at a 3/10 ? ? ?She is on oxybutynin but it dried her out and she could not tolerate it, went back to tolterodine 2 mg tablet which helps without side effects. Would like Rx for this.  ? ?Review of Systems:  ?Review of Systems  ?Constitutional:  Negative for chills and fever.  ?Genitourinary:  Positive for frequency. Negative for  dysuria and urgency.  ?Musculoskeletal:  Positive for myalgias and neck pain. Negative for joint pain.  ? ?Past Medical History:  ?Diagnosis Date  ? Hyperlipidemia   ? Osteoporosis   ? Seasonal allergies   ? ?Past Surgical History:  ?Procedure Laterality Date  ? BASAL CELL CARCINOMA EXCISION    ? hysterectomy    ? Dr. Bartolo Darter  ? [place] EXTRACTION  1974  ? ?Social History: ?  reports that she has never smoked. She has never used smokeless tobacco. She reports current alcohol use. She reports that she does not use drugs. ? ?Family History  ?Problem Relation Age of Onset  ? Dementia Mother 17  ? Osteoporosis Mother   ? Heart failure Father   ?     Deceased in 02-21-2021   ? Atrial fibrillation Father   ? Sick sinus syndrome Father   ? Heart disease Father   ? Diabetes Maternal Grandmother   ? Arthritis Maternal Grandfather   ? Dementia Maternal Grandfather   ? Cancer Paternal Grandmother   ? Heart attack Paternal Uncle   ? Stroke Paternal Aunt   ? ? ?Medications: ?Patient's Medications  ?New Prescriptions  ? No medications on file  ?Previous Medications  ? ACETAMINOPHEN (TYLENOL) 500 MG TABLET    Take 1,000 mg by mouth as needed.  ? ATORVASTATIN (LIPITOR) 20  MG TABLET    TAKE 1 TABLET BY MOUTH EVERY DAY  ? FLUTICASONE (FLONASE) 50 MCG/ACT NASAL SPRAY    Place 2 sprays into both nostrils at bedtime.  ? LEVOCETIRIZINE (XYZAL) 5 MG TABLET    Take 5 mg by mouth every evening.  ? MULTIPLE MINERALS-VITAMINS (CITRACAL MAXIMUM PLUS) TABS    Take 2 tablets by mouth daily at 12 noon.  ? OMEGA-3 FATTY ACIDS (FISH OIL PO)    Take by mouth daily.  ? OXYBUTYNIN (DITROPAN-XL) 10 MG 24 HR TABLET    Take 10 mg by mouth as needed.  ? PANTOPRAZOLE (PROTONIX) 40 MG TABLET    Take 40 mg by mouth as needed.  ? PROBIOTIC PRODUCT (PROBIOTIC DAILY PO)    Take by mouth. [employer] [facility] probiotic  ? TIZANIDINE (ZANAFLEX) 2 MG CAPSULE    Take 1 capsule (2 mg total) by mouth 3 (three) times daily as needed for muscle spasms.  ?Modified  Medications  ? No medications on file  ?Discontinued Medications  ? No medications on file  ? ? ?Physical Exam: ? ?Vitals:  ? 04/23/22 1516  ?BP: 132/82  ?Pulse: 82  ?Resp: 16  ?Temp: (!) 97.4 ?F (36.3 ?C)  ?SpO2: 97%  ?Weight: 178 lb (80.7 kg)  ?Height: 5' 5.5" (1.664 m)  ? ?Body mass index is 29.17 kg/m?. ?Wt Readings from Last 3 Encounters:  ?04/23/22 178 lb (80.7 kg)  ?04/06/22 175 lb (79.4 kg)  ?07/18/21 176 lb (79.8 kg)  ? ? ?Physical Exam ?Neck:  ? ?   Comments: Tenderness to right side of neck extended down to arm. ?Musculoskeletal:  ?   Cervical back: Pain with movement and muscular tenderness present. No spinous process tenderness.  ?Skin: ?   General: Skin is warm and dry.  ? ? ?Labs reviewed: ?Basic Metabolic Panel: ?Recent Labs  ?  06/06/21 ?1010 08/14/21 ?0909  ?NA 139 144  ?K 4.2 3.8  ?CL 105 103  ?CO2 22 25  ?GLUCOSE 93 87  ?BUN 21 20  ?CREATININE 0.79 0.77  ?CALCIUM 9.7 9.6  ? ?Liver Function Tests: ?Recent Labs  ?  06/06/21 ?1010  ?AST 26  ?ALT 31*  ?BILITOT 0.5  ?PROT 6.7  ? ?No results for input(s): LIPASE, AMYLASE in the last 8760 hours. ?No results for input(s): AMMONIA in the last 8760 hours. ?CBC: ?Recent Labs  ?  06/06/21 ?1010  ?WBC 3.2*  ?NEUTROABS 1,725  ?HGB 14.4  ?HCT 43.3  ?MCV 92.3  ?PLT 245  ? ?Lipid Panel: ?Recent Labs  ?  06/06/21 ?1010  ?CHOL 181  ?HDL 83  ?[place] 81  ?TRIG 85  ?CHOLHDL 2.2  ? ?TSH: ?No results for input(s): TSH in the last 8760 hours. ?A1C: ?Lab Results  ?Component Value Date  ? HGBA1C 5.2 05/13/2018  ? ? ? ?Assessment/Plan ?1. Neck pain ?-recommend supportive pillow ?-decrease activity that causes discomfort or overuse.  ?-continue heating pad PRN ?- predniSONE (STERAPRED UNI-PAK 21 TAB) 10 MG (21) TBPK tablet; Use as directed  Dispense: 21 tablet; Refill: 0 ?- DG Cervical Spine Complete; Future ?- Ambulatory referral to Neurosurgery ? ?2. Overactive bladder ?- tolterodine (DETROL LA) 2 MG 24 hr capsule; Take 1 capsule (2 mg total) by mouth daily.  Dispense: 30  capsule; Refill: 1 ? ? ?Carlos American. Dewaine Oats, AGNP ? ?[facility] ?[phone]  ?

## 2022-05-10 ENCOUNTER — Other Ambulatory Visit: Payer: Self-pay | Admitting: Nurse Practitioner

## 2022-05-10 DIAGNOSIS — M810 Age-related osteoporosis without current pathological fracture: Secondary | ICD-10-CM

## 2022-05-10 DIAGNOSIS — J302 Other seasonal allergic rhinitis: Secondary | ICD-10-CM

## 2022-05-10 DIAGNOSIS — E782 Mixed hyperlipidemia: Secondary | ICD-10-CM

## 2022-05-23 DIAGNOSIS — M5412 Radiculopathy, cervical region: Secondary | ICD-10-CM | POA: Diagnosis not present

## 2022-05-23 DIAGNOSIS — Z6829 Body mass index (BMI) 29.0-29.9, adult: Secondary | ICD-10-CM | POA: Diagnosis not present

## 2022-06-06 ENCOUNTER — Other Ambulatory Visit: Payer: Medicare Other

## 2022-06-07 ENCOUNTER — Other Ambulatory Visit: Payer: PPO

## 2022-06-07 DIAGNOSIS — M4312 Spondylolisthesis, cervical region: Secondary | ICD-10-CM | POA: Diagnosis not present

## 2022-06-07 DIAGNOSIS — J302 Other seasonal allergic rhinitis: Secondary | ICD-10-CM | POA: Diagnosis not present

## 2022-06-07 DIAGNOSIS — E782 Mixed hyperlipidemia: Secondary | ICD-10-CM

## 2022-06-07 DIAGNOSIS — M810 Age-related osteoporosis without current pathological fracture: Secondary | ICD-10-CM

## 2022-06-07 DIAGNOSIS — M542 Cervicalgia: Secondary | ICD-10-CM | POA: Diagnosis not present

## 2022-06-07 DIAGNOSIS — M5412 Radiculopathy, cervical region: Secondary | ICD-10-CM | POA: Diagnosis not present

## 2022-06-07 LAB — CBC WITH DIFFERENTIAL/PLATELET
Absolute Monocytes: 310 cells/uL (ref 200–950)
Basophils Absolute: 19 cells/uL (ref 0–200)
Basophils Relative: 0.6 %
Eosinophils Absolute: 99 cells/uL (ref 15–500)
Eosinophils Relative: 3.1 %
HCT: 40.7 % (ref 35.0–45.0)
Hemoglobin: 13.8 g/dL (ref 11.7–15.5)
Lymphs Abs: 960 cells/uL (ref 850–3900)
MCH: 31.8 pg (ref 27.0–33.0)
MCHC: 33.9 g/dL (ref 32.0–36.0)
MCV: 93.8 fL (ref 80.0–100.0)
MPV: 9 fL (ref 7.5–12.5)
Monocytes Relative: 9.7 %
Neutro Abs: 1811 cells/uL (ref 1500–7800)
Neutrophils Relative %: 56.6 %
Platelets: 189 10*3/uL (ref 140–400)
RBC: 4.34 10*6/uL (ref 3.80–5.10)
RDW: 12.1 % (ref 11.0–15.0)
Total Lymphocyte: 30 %
WBC: 3.2 10*3/uL — ABNORMAL LOW (ref 3.8–10.8)

## 2022-06-07 LAB — COMPLETE METABOLIC PANEL WITH GFR
AG Ratio: 2 (calc) (ref 1.0–2.5)
ALT: 32 U/L — ABNORMAL HIGH (ref 6–29)
AST: 30 U/L (ref 10–35)
Albumin: 4.2 g/dL (ref 3.6–5.1)
Alkaline phosphatase (APISO): 51 U/L (ref 37–153)
BUN: 19 mg/dL (ref 7–25)
CO2: 28 mmol/L (ref 20–32)
Calcium: 8.9 mg/dL (ref 8.6–10.4)
Chloride: 108 mmol/L (ref 98–110)
Creat: 0.84 mg/dL (ref 0.50–1.05)
Globulin: 2.1 g/dL (calc) (ref 1.9–3.7)
Glucose, Bld: 91 mg/dL (ref 65–99)
Potassium: 4.1 mmol/L (ref 3.5–5.3)
Sodium: 142 mmol/L (ref 135–146)
Total Bilirubin: 0.4 mg/dL (ref 0.2–1.2)
Total Protein: 6.3 g/dL (ref 6.1–8.1)
eGFR: 76 mL/min/{1.73_m2} (ref >=60)

## 2022-06-07 LAB — LIPID PANEL
Cholesterol: 193 mg/dL (ref <200)
HDL: 99 mg/dL (ref >=50)
LDL Cholesterol (Calc): 80 mg/dL (calc)
Non-HDL Cholesterol (Calc): 94 mg/dL (calc) (ref <130)
Total CHOL/HDL Ratio: 1.9 (calc) (ref <5.0)
Triglycerides: 60 mg/dL (ref <150)

## 2022-06-11 ENCOUNTER — Ambulatory Visit (INDEPENDENT_AMBULATORY_CARE_PROVIDER_SITE_OTHER): Payer: PPO | Admitting: Nurse Practitioner

## 2022-06-11 ENCOUNTER — Encounter: Payer: Self-pay | Admitting: Nurse Practitioner

## 2022-06-11 VITALS — BP 110/72 | HR 91 | Temp 97.4°F | Resp 16 | Ht 65.5 in | Wt 177.0 lb

## 2022-06-11 DIAGNOSIS — K219 Gastro-esophageal reflux disease without esophagitis: Secondary | ICD-10-CM | POA: Diagnosis not present

## 2022-06-11 DIAGNOSIS — M4802 Spinal stenosis, cervical region: Secondary | ICD-10-CM | POA: Diagnosis not present

## 2022-06-11 DIAGNOSIS — M810 Age-related osteoporosis without current pathological fracture: Secondary | ICD-10-CM | POA: Diagnosis not present

## 2022-06-11 DIAGNOSIS — M542 Cervicalgia: Secondary | ICD-10-CM | POA: Diagnosis not present

## 2022-06-11 DIAGNOSIS — E782 Mixed hyperlipidemia: Secondary | ICD-10-CM | POA: Diagnosis not present

## 2022-06-11 DIAGNOSIS — J302 Other seasonal allergic rhinitis: Secondary | ICD-10-CM | POA: Diagnosis not present

## 2022-06-11 DIAGNOSIS — N3281 Overactive bladder: Secondary | ICD-10-CM | POA: Diagnosis not present

## 2022-06-11 DIAGNOSIS — M4722 Other spondylosis with radiculopathy, cervical region: Secondary | ICD-10-CM | POA: Diagnosis not present

## 2022-06-11 MED ORDER — TOLTERODINE TARTRATE 2 MG PO TABS: 2.0000 mg | ORAL_TABLET | Freq: Two times a day (BID) | ORAL | 1 refills | Status: DC

## 2022-06-11 NOTE — Progress Notes (Signed)
Careteam: Patient Care Team: Lauree Chandler, NP as PCP - General (Geriatric Medicine) Hale Bogus., MD as Referring Physician (Gastroenterology) Marylynn Pearson, MD as Consulting Physician (Obstetrics and Gynecology) Bjorn Loser, MD as Consulting Physician (Urology) Jacelyn Pi, MD as Consulting Physician (Endocrinology)  PLACE OF SERVICE:  [place] Directive information Does Patient Have a Medical Advance Directive?: No, Would patient like information on creating a medical advance directive?: No - Patient declined  Allergies  Allergen Reactions   Fish Allergy     Any fish in the flounder family causes rash, difficulty breathing, swelling of eyes, itching, water/swelling under eyes   Penicillins     Swelling of the lips and a rash   Tape     Localized rash    Chief Complaint  Patient presents with   Annual Exam    PHYSICAL   Review Labs     Review recent labs.   Concern     Patient wants to discuss changing medication Tolterodine '2mg'$ . Patient states that medication isnt covered by insurance and medication isnt working well for her.      HPI: Patient is a 71 y.o. female  She has not been routine about taking her morning vitamins.  Has now started to take her calicum better.   Hyperlipidemia- stable, on atorvastatin 20 mg daily at bedtime  Reports her tolterodine is now extended release but does not feel like it is helping like the short acting medication was. Does well with the tolterodine, no significant drying like oxybutynin gave her.   GERD- using protonix.   Allergies are well controlled on xyzal.   Ongoing neck pain- plans to get MRI and following with neuro due to this.  Continues to exercise. Staying active.   Review of Systems:  Review of Systems  Constitutional:  Negative for chills, fever and weight loss.  HENT:  Negative for tinnitus.   Respiratory:  Negative for cough, sputum production and shortness of breath.    Cardiovascular:  Negative for chest pain, palpitations and leg swelling.  Gastrointestinal:  Negative for abdominal pain, constipation, diarrhea and heartburn.  Genitourinary:  Negative for dysuria, frequency and urgency.  Musculoskeletal:  Negative for back pain, falls, joint pain and myalgias.  Skin: Negative.   Neurological:  Negative for dizziness and headaches.  Psychiatric/Behavioral:  Negative for depression and memory loss. The patient does not have insomnia.     Past Medical History:  Diagnosis Date   Hyperlipidemia    Osteoporosis    Seasonal allergies    Past Surgical History:  Procedure Laterality Date   BASAL CELL CARCINOMA EXCISION     hysterectomy     Dr. Bartolo Darter   [place] TO[place] EXTRACTION  1974   Social History:   reports that she has never smoked. She has never used smokeless tobacco. She reports current alcohol use. She reports that she does not use drugs.  Family History  Problem Relation Age of Onset   Dementia Mother 55   Osteoporosis Mother    Heart failure Father        Deceased in Feb 17, 2021    Atrial fibrillation Father    Sick sinus syndrome Father    Heart disease Father    Diabetes Maternal Grandmother    Arthritis Maternal Grandfather    Dementia Maternal Grandfather    Cancer Paternal Grandmother    Heart attack Paternal Uncle    Stroke Paternal Aunt     Medications: Patient's Medications  New Prescriptions   No medications on file  Previous Medications   ACETAMINOPHEN (TYLENOL) 500 MG TABLET    Take 1,000 mg by mouth as needed.   ATORVASTATIN (LIPITOR) 20 MG TABLET    TAKE 1 TABLET BY MOUTH EVERY DAY   FLUTICASONE (FLONASE) 50 MCG/ACT NASAL SPRAY    Place 2 sprays into both nostrils at bedtime.   LEVOCETIRIZINE (XYZAL) 5 MG TABLET    Take 5 mg by mouth every evening.   MULTIPLE MINERALS-VITAMINS (CITRACAL MAXIMUM PLUS) TABS    Take 2 tablets by mouth daily at 12 noon.   OMEGA-3 FATTY ACIDS (FISH OIL PO)    Take by mouth daily.    PANTOPRAZOLE (PROTONIX) 40 MG TABLET    Take 40 mg by mouth as needed.   PREDNISONE (STERAPRED UNI-PAK 21 TAB) 10 MG (21) TBPK TABLET    Use as directed   PROBIOTIC PRODUCT (PROBIOTIC DAILY PO)    Take by mouth. [employer] [facility] probiotic   TIZANIDINE (ZANAFLEX) 2 MG CAPSULE    Take 1 capsule (2 mg total) by mouth 3 (three) times daily as needed for muscle spasms.   TOLTERODINE (DETROL LA) 2 MG 24 HR CAPSULE    Take 1 capsule (2 mg total) by mouth daily.  Modified Medications   No medications on file  Discontinued Medications   OXYBUTYNIN (DITROPAN-XL) 10 MG 24 HR TABLET    Take 10 mg by mouth as needed.    Physical Exam:  Vitals:   06/11/22 0956  BP: 110/72  Pulse: 91  Resp: 16  Temp: (!) 97.4 F (36.3 C)  SpO2: 91%  Weight: 177 lb (80.3 kg)  Height: 5' 5.5" (1.664 m)   Body mass index is 29.01 kg/m. Wt Readings from Last 3 Encounters:  06/11/22 177 lb (80.3 kg)  04/23/22 178 lb (80.7 kg)  04/06/22 175 lb (79.4 kg)    Physical Exam Constitutional:      General: She is not in acute distress.    Appearance: She is well-developed. She is not diaphoretic.  HENT:     Head: Normocephalic and atraumatic.     Mouth/Throat:     Pharynx: No oropharyngeal exudate.  Eyes:     Conjunctiva/sclera: Conjunctivae normal.     Pupils: Pupils are equal, round, and reactive to light.  Cardiovascular:     Rate and Rhythm: Normal rate and regular rhythm.     Heart sounds: Normal heart sounds.  Pulmonary:     Effort: Pulmonary effort is normal.     Breath sounds: Normal breath sounds.  Abdominal:     General: Bowel sounds are normal.     Palpations: Abdomen is soft.  Musculoskeletal:     Cervical back: Normal range of motion and neck supple.     Right lower leg: No edema.     Left lower leg: No edema.  Skin:    General: Skin is warm and dry.  Neurological:     Mental Status: She is alert.  Psychiatric:        Mood and Affect: Mood normal.     Labs reviewed: Basic Metabolic  Panel: Recent Labs    08/14/21 0909 06/07/22 0909  NA 144 142  K 3.8 4.1  CL 103 108  CO2 25 28  GLUCOSE 87 91  BUN 20 19  CREATININE 0.77 0.84  CALCIUM 9.6 8.9   Liver Function Tests: Recent Labs    06/07/22 0909  AST 30  ALT 32*  BILITOT 0.4  PROT 6.3   No results for input(s): "LIPASE", "AMYLASE" in the last 8760 hours. No results for input(s): "AMMONIA" in the last 8760 hours. CBC: Recent Labs    06/07/22 0909  WBC 3.2*  NEUTROABS 1,811  HGB 13.8  HCT 40.7  MCV 93.8  PLT 189   Lipid Panel: Recent Labs    06/07/22 0909  CHOL 193  HDL 99  LDLCALC 80  TRIG 60  CHOLHDL 1.9   TSH: No results for input(s): "TSH" in the last 8760 hours. A1C: Lab Results  Component Value Date   HGBA1C 5.2 05/13/2018     Assessment/Plan  1. Age-related osteoporosis without current pathological fracture -followed by GYN, Recommended to take calcium 600 mg twice daily with Vitamin D 2000 units daily and weight bearing activity 30 mins/5 days a week. Previously on fosamax but has stopped treatment at this time   2. Overactive bladder Would like to not have ER. Will change to IR and monitor.  - tolterodine (DETROL) 2 MG tablet; Take 1 tablet (2 mg total) by mouth 2 (two) times daily.  Dispense: 180 tablet; Refill: 1  3. Neck pain -improved at this time.   4. Seasonal allergies Controlled on xyzal  5. Mixed hyperlipidemia -continues on lipitor 20 mg dialy  6. GERD Stable on protonix    Return in about 6 months (around 12/11/2022) for routine follow up. Carlos American. [place], [facility] [phone]

## 2022-06-15 ENCOUNTER — Other Ambulatory Visit: Payer: Self-pay | Admitting: Nurse Practitioner

## 2022-06-15 DIAGNOSIS — E782 Mixed hyperlipidemia: Secondary | ICD-10-CM

## 2022-06-19 ENCOUNTER — Other Ambulatory Visit: Payer: Self-pay | Admitting: Nurse Practitioner

## 2022-06-19 DIAGNOSIS — N3281 Overactive bladder: Secondary | ICD-10-CM

## 2022-07-09 ENCOUNTER — Other Ambulatory Visit: Payer: Self-pay | Admitting: Nurse Practitioner

## 2022-07-09 DIAGNOSIS — R059 Cough, unspecified: Secondary | ICD-10-CM

## 2022-07-09 NOTE — Telephone Encounter (Signed)
Patient has request refill on medication Pantoprazole. Medication hasn't been filled by PCP Lauree Chandler, NP . Medication pend and sent to Ok Edwards, NP due to PCP being out of office.

## 2022-08-14 DIAGNOSIS — M25562 Pain in left knee: Secondary | ICD-10-CM | POA: Diagnosis not present

## 2022-09-28 DIAGNOSIS — M25562 Pain in left knee: Secondary | ICD-10-CM | POA: Diagnosis not present

## 2022-10-02 DIAGNOSIS — S83232D Complex tear of medial meniscus, current injury, left knee, subsequent encounter: Secondary | ICD-10-CM | POA: Diagnosis not present

## 2022-10-19 DIAGNOSIS — Z6829 Body mass index (BMI) 29.0-29.9, adult: Secondary | ICD-10-CM | POA: Diagnosis not present

## 2022-10-19 DIAGNOSIS — M4722 Other spondylosis with radiculopathy, cervical region: Secondary | ICD-10-CM | POA: Diagnosis not present

## 2022-11-16 IMAGING — CR DG CERVICAL SPINE COMPLETE 4+V
7 series · 7 of 7 positions shown · non-contrast
Comparison: X-ray 11/03/2018.

CLINICAL DATA: Neck pain, right arm numbness since [REDACTED].

EXAM:
CERVICAL SPINE - COMPLETE 4+ VIEW

[w cervical spine lat (1 of 2)]
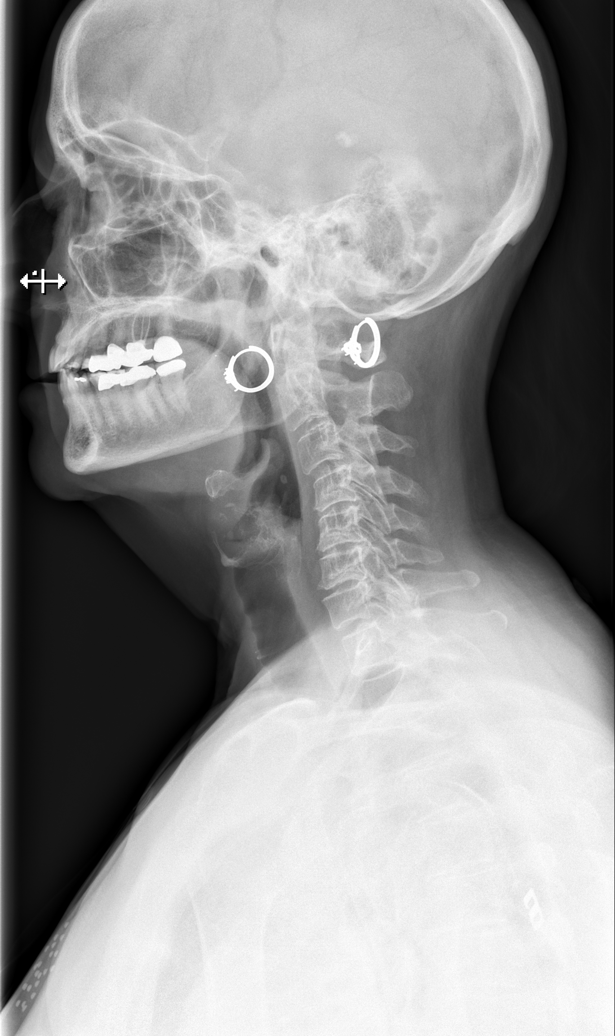

[w cervical spine lat (2 of 2)]
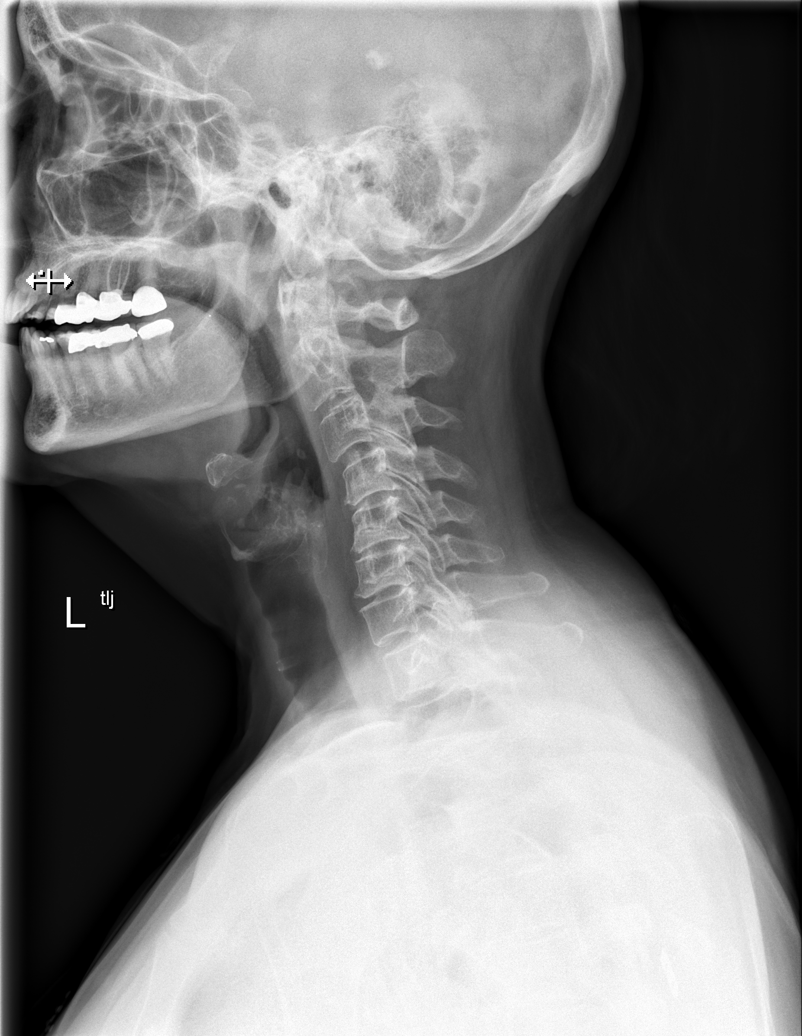

[w cervical spine ap_obl (1 of 2)]
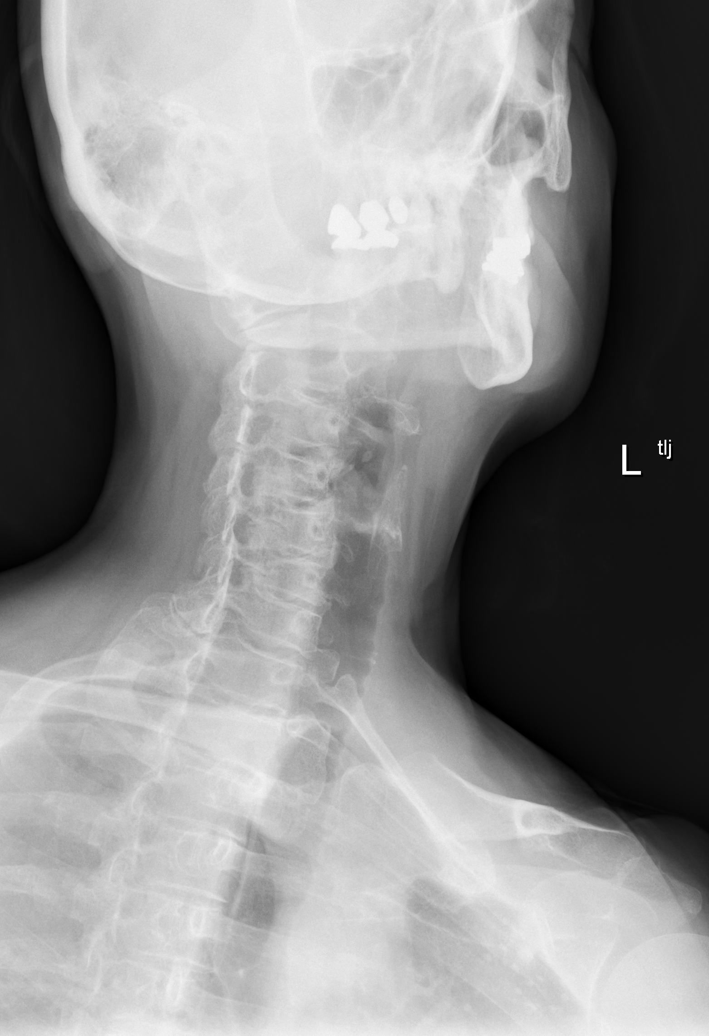

[w cervical spine ap_obl (2 of 2)]
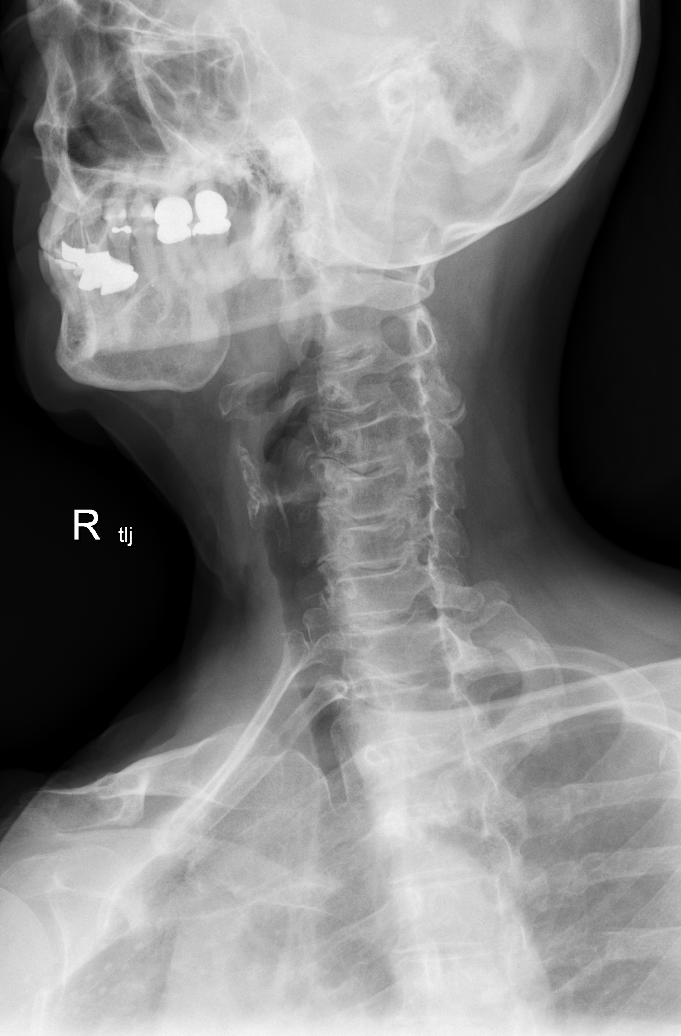

[w cervical spine ap]
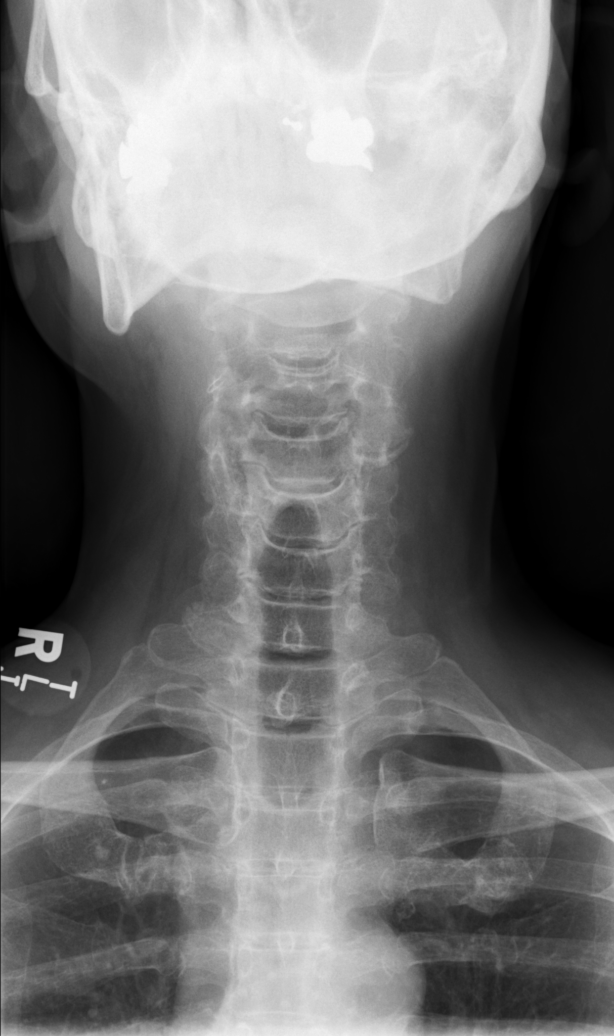

[w cervical spine odontoid (1 of 2)]
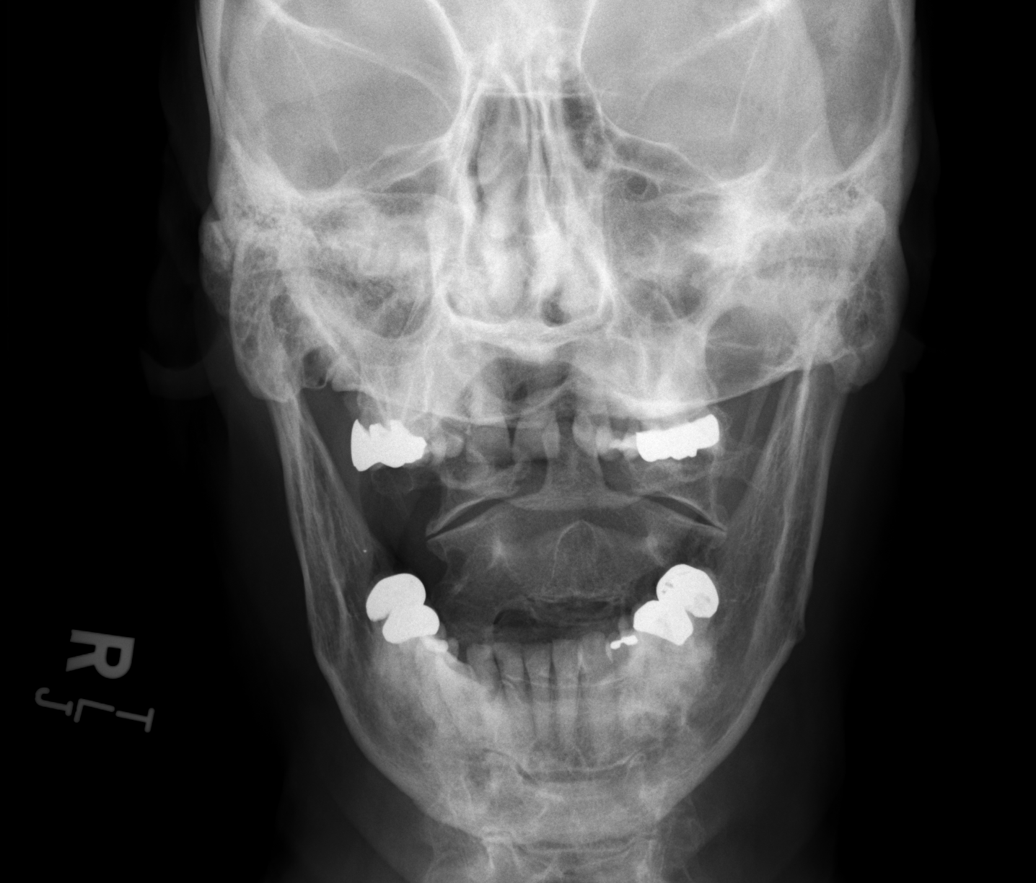

[w cervical spine odontoid (2 of 2)]
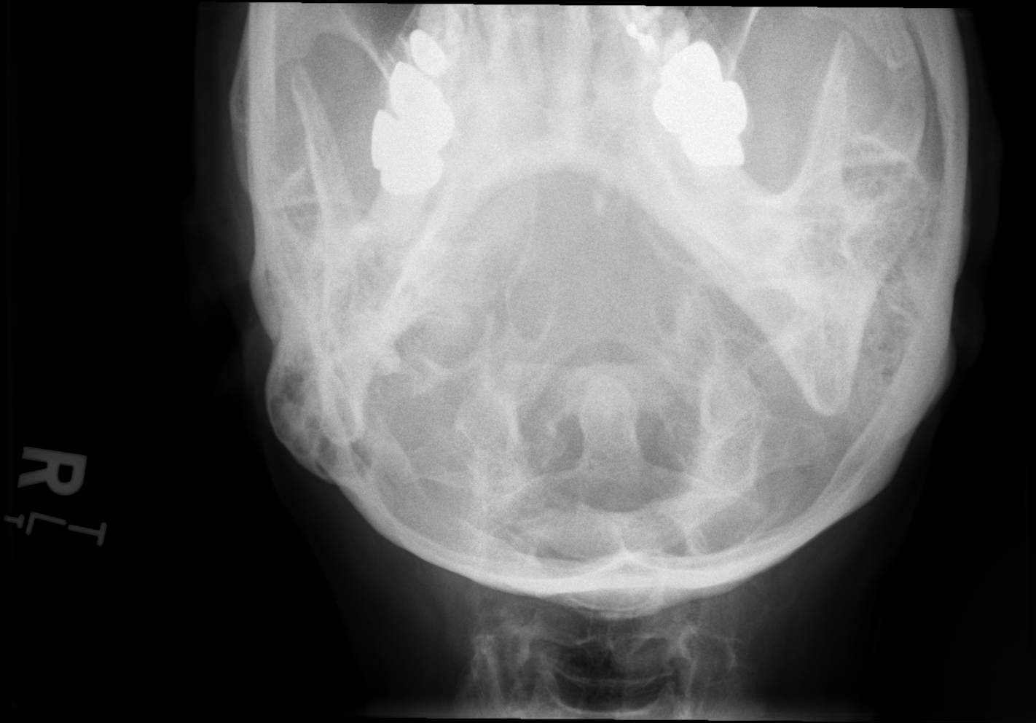

[7 of 7 positions shown; findings below may reference images not displayed]

FINDINGS: No recent fracture is seen. There is straightening of lordosis.
There is minimal 2 mm anterolisthesis at C3-C4 level which may be
residual from previous ligament injury and facet degeneration.
Similar finding was seen in the previous study. Degenerative changes
are noted with bony spurs from C4-C7 levels. There is encroachment
of neural foramina from C4-C7 levels, more severe at C5-C6 and C6-C7
levels. There is interval progression of degenerative changes.
Prevertebral soft tissues are unremarkable.
IMPRESSION: No recent fracture is seen in the cervical spine. Cervical
spondylosis with encroachment of neural foramina from C4-C7 levels
with interval progression. Minimal anterolisthesis at C3-C4 level
may be due to previous ligament injury and facet degeneration.

## 2022-11-26 DIAGNOSIS — D225 Melanocytic nevi of trunk: Secondary | ICD-10-CM | POA: Diagnosis not present

## 2022-11-26 DIAGNOSIS — L814 Other melanin hyperpigmentation: Secondary | ICD-10-CM | POA: Diagnosis not present

## 2022-11-26 DIAGNOSIS — L821 Other seborrheic keratosis: Secondary | ICD-10-CM | POA: Diagnosis not present

## 2022-11-26 DIAGNOSIS — Z85828 Personal history of other malignant neoplasm of skin: Secondary | ICD-10-CM | POA: Diagnosis not present

## 2022-11-26 DIAGNOSIS — D1801 Hemangioma of skin and subcutaneous tissue: Secondary | ICD-10-CM | POA: Diagnosis not present

## 2022-12-05 ENCOUNTER — Other Ambulatory Visit: Payer: Self-pay | Admitting: Nurse Practitioner

## 2022-12-05 DIAGNOSIS — E782 Mixed hyperlipidemia: Secondary | ICD-10-CM

## 2022-12-08 ENCOUNTER — Other Ambulatory Visit: Payer: Self-pay | Admitting: Nurse Practitioner

## 2022-12-08 DIAGNOSIS — N3281 Overactive bladder: Secondary | ICD-10-CM

## 2022-12-10 ENCOUNTER — Encounter: Payer: PPO | Admitting: Nurse Practitioner

## 2022-12-13 ENCOUNTER — Encounter: Payer: PPO | Admitting: Nurse Practitioner

## 2023-01-01 ENCOUNTER — Encounter: Payer: Self-pay | Admitting: Nurse Practitioner

## 2023-01-01 NOTE — Progress Notes (Unsigned)
   This service is provided via telemedicine  No vital signs collected/recorded due to the encounter was a telemedicine visit.   Location of patient (ex: home, work):  Home  Patient consents to a telephone visit: Yes, see telephone visit dated   Location of the provider (ex: office, home):  [facility], Office   Name of any referring provider:  N/A  Names of all persons participating in the telemedicine service and their role in the encounter:  Bethany B/CMA, [name], NP, and Patient   Time spent on call:  9 min with medical assistant

## 2023-01-02 ENCOUNTER — Ambulatory Visit (SKILLED_NURSING_FACILITY): Payer: PPO | Admitting: Nurse Practitioner

## 2023-01-02 ENCOUNTER — Encounter: Payer: Self-pay | Admitting: Nurse Practitioner

## 2023-01-02 ENCOUNTER — Telehealth: Payer: Self-pay

## 2023-01-02 DIAGNOSIS — Z0000 Encounter for general adult medical examination without abnormal findings: Secondary | ICD-10-CM

## 2023-01-02 NOTE — Patient Instructions (Signed)
[name] , Thank you for taking time to come for your Medicare Wellness Visit. I appreciate your ongoing commitment to your health goals. Please review the following plan we discussed and let me know if I can assist you in the future.   Screening recommendations/referrals: Colonoscopy up to date Mammogram to call GYN and schedule appt to get mammogram Bone Density gets through GYN  Recommended yearly ophthalmology/optometry visit for glaucoma screening and checkup Recommended yearly dental visit for hygiene and checkup  Vaccinations: Influenza vaccine- due annually in September/October Pneumococcal vaccine up to date Tdap vaccine up to date Shingles vaccine up to date    Advanced directives: recommended to bring to office to place on file.   Conditions/risks identified: advance age  Next appointment: yearly for awv   Preventive Care 70 Years and Older, Female Preventive care refers to lifestyle choices and visits with your health care provider that can promote health and wellness. What does preventive care include? A yearly physical exam. This is also called an annual well check. Dental exams once or twice a year. Routine eye exams. Ask your health care provider how often you should have your eyes checked. Personal lifestyle choices, including: Daily care of your teeth and gums. Regular physical activity. Eating a healthy diet. Avoiding tobacco and drug use. Limiting alcohol use. Practicing safe sex. Taking low-dose aspirin every day. Taking vitamin and mineral supplements as recommended by your health care provider. What happens during an annual well check? The services and screenings done by your health care provider during your annual well check will depend on your age, overall health, lifestyle risk factors, and family history of disease. Counseling  Your health care provider may ask you questions about your: Alcohol use. Tobacco use. Drug use. Emotional  well-being. Home and relationship well-being. Sexual activity. Eating habits. History of falls. Memory and ability to understand (cognition). Work and work Statistician. Reproductive health. Screening  You may have the following tests or measurements: Height, weight, and BMI. Blood pressure. Lipid and cholesterol levels. These may be checked every 5 years, or more frequently if you are over 71 years old. Skin check. Lung cancer screening. You may have this screening every year starting at age 27 if you have a 30-pack-year history of smoking and currently smoke or have quit within the past 15 years. Fecal occult blood test (FOBT) of the stool. You may have this test every year starting at age 89. Flexible sigmoidoscopy or colonoscopy. You may have a sigmoidoscopy every 5 years or a colonoscopy every 10 years starting at age 71. Hepatitis C blood test. Hepatitis B blood test. Sexually transmitted disease (STD) testing. Diabetes screening. This is done by checking your blood sugar (glucose) after you have not eaten for a while (fasting). You may have this done every 1-3 years. Bone density scan. This is done to screen for osteoporosis. You may have this done starting at age 14. Mammogram. This may be done every 1-2 years. Talk to your health care provider about how often you should have regular mammograms. Talk with your health care provider about your test results, treatment options, and if necessary, the need for more tests. Vaccines  Your health care provider may recommend certain vaccines, such as: Influenza vaccine. This is recommended every year. Tetanus, diphtheria, and acellular pertussis (Tdap, Td) vaccine. You may need a Td booster every 10 years. Zoster vaccine. You may need this after age 77. Pneumococcal 13-valent conjugate (PCV13) vaccine. One dose is recommended after age 57.  Pneumococcal polysaccharide (PPSV23) vaccine. One dose is recommended after age 4. Talk to your  health care provider about which screenings and vaccines you need and how often you need them. This information is not intended to replace advice given to you by your health care provider. Make sure you discuss any questions you have with your health care provider. Document Released: 01/13/2016 Document Revised: 09/05/2016 Document Reviewed: 10/18/2015 Elsevier Interactive Patient Education  2017 [place] Prevention in the Home Falls can cause injuries. They can happen to people of all ages. There are many things you can do to make your home safe and to help prevent falls. What can I do on the outside of my home? Regularly fix the edges of walkways and driveways and fix any cracks. Remove anything that might make you trip as you walk through a door, such as a raised step or threshold. Trim any bushes or trees on the path to your home. Use bright outdoor lighting. Clear any walking paths of anything that might make someone trip, such as rocks or tools. Regularly check to see if handrails are loose or broken. Make sure that both sides of any steps have handrails. Any raised decks and porches should have guardrails on the edges. Have any leaves, snow, or ice cleared regularly. Use sand or salt on walking paths during winter. Clean up any spills in your garage right away. This includes oil or grease spills. What can I do in the bathroom? Use night lights. Install grab bars by the toilet and in the tub and shower. Do not use towel bars as grab bars. Use non-skid mats or decals in the tub or shower. If you need to sit down in the shower, use a plastic, non-slip stool. Keep the floor dry. Clean up any water that spills on the floor as soon as it happens. Remove soap buildup in the tub or shower regularly. Attach bath mats securely with double-sided non-slip rug tape. Do not have throw rugs and other things on the floor that can make you trip. What can I do in the bedroom? Use night  lights. Make sure that you have a light by your bed that is easy to reach. Do not use any sheets or blankets that are too big for your bed. They should not hang down onto the floor. Have a firm chair that has side arms. You can use this for support while you get dressed. Do not have throw rugs and other things on the floor that can make you trip. What can I do in the kitchen? Clean up any spills right away. Avoid walking on wet floors. Keep items that you use a lot in easy-to-reach places. If you need to reach something above you, use a strong step stool that has a grab bar. Keep electrical cords out of the way. Do not use floor polish or wax that makes floors slippery. If you must use wax, use non-skid floor wax. Do not have throw rugs and other things on the floor that can make you trip. What can I do with my stairs? Do not leave any items on the stairs. Make sure that there are handrails on both sides of the stairs and use them. Fix handrails that are broken or loose. Make sure that handrails are as long as the stairways. Check any carpeting to make sure that it is firmly attached to the stairs. Fix any carpet that is loose or worn. Avoid having throw rugs at the  top or bottom of the stairs. If you do have throw rugs, attach them to the floor with carpet tape. Make sure that you have a light switch at the top of the stairs and the bottom of the stairs. If you do not have them, ask someone to add them for you. What else can I do to help prevent falls? Wear shoes that: Do not have high heels. Have rubber bottoms. Are comfortable and fit you well. Are closed at the toe. Do not wear sandals. If you use a stepladder: Make sure that it is fully opened. Do not climb a closed stepladder. Make sure that both sides of the stepladder are locked into place. Ask someone to hold it for you, if possible. Clearly mark and make sure that you can see: Any grab bars or handrails. First and last  steps. Where the edge of each step is. Use tools that help you move around (mobility aids) if they are needed. These include: Canes. Walkers. Scooters. Crutches. Turn on the lights when you go into a dark area. Replace any light bulbs as soon as they burn out. Set up your furniture so you have a clear path. Avoid moving your furniture around. If any of your floors are uneven, fix them. If there are any pets around you, be aware of where they are. Review your medicines with your doctor. Some medicines can make you feel dizzy. This can increase your chance of falling. Ask your doctor what other things that you can do to help prevent falls. This information is not intended to replace advice given to you by your health care provider. Make sure you discuss any questions you have with your health care provider. Document Released: 10/13/2009 Document Revised: 05/24/2016 Document Reviewed: 01/21/2015 Elsevier Interactive Patient Education  2017 [employer].

## 2023-01-02 NOTE — Progress Notes (Signed)
Subjective:   Mary Short is a 72 y.o. female who presents for Medicare Annual (Subsequent) preventive examination.  Review of Systems     Cardiac Risk Factors include: advanced age (>27mn, >>37women);sedentary lifestyle     Objective:    There were no vitals filed for this visit. There is no height or weight on file to calculate BMI.     01/02/2023   12:34 PM 06/11/2022   10:05 AM 04/23/2022    3:21 PM 11/28/2021    1:18 PM 06/09/2021    9:10 AM 06/08/2020    9:34 AM 11/18/2019   10:48 AM  Advanced Directives  Does Patient Have a Medical Advance Directive? Yes No No No Yes Yes Yes  Type of Advance Directive     Living will Living will Living will  Does patient want to make changes to medical advance directive? No - Guardian declined    No - Patient declined No - Patient declined No - Patient declined  Would patient like information on creating a medical advance directive?  No - Patient declined No - Patient declined        Current Medications (verified) Outpatient Encounter Medications as of 01/02/2023  Medication Sig   acetaminophen (TYLENOL) 500 MG tablet Take 1,000 mg by mouth as needed.   atorvastatin (LIPITOR) 20 MG tablet TAKE 1 TABLET BY MOUTH EVERY DAY   levocetirizine (XYZAL) 5 MG tablet Take 5 mg by mouth every evening.   Multiple Minerals-Vitamins (CITRACAL MAXIMUM PLUS) TABS Take 2 tablets by mouth daily at 12 noon.   Omega-3 Fatty Acids (FISH OIL PO) Take by mouth daily.   pantoprazole (PROTONIX) 40 MG tablet TAKE 1 TABLET BY MOUTH EVERY DAY   tolterodine (DETROL) 2 MG tablet Take 1 tablet (2 mg total) by mouth 2 (two) times daily. Needs an appointment before anymore future refills.   No facility-administered encounter medications on file as of 01/02/2023.    Allergies (verified) Fish allergy, Penicillins, and Tape   History: Past Medical History:  Diagnosis Date   Hyperlipidemia    Osteoporosis    Seasonal allergies    Past Surgical History:   Procedure Laterality Date   BASAL CELL CARCINOMA EXCISION     hysterectomy     Dr. KBartolo Darter  WISDOM TOOTH EXTRACTION  1974   Family History  Problem Relation Age of Onset   Dementia Mother [id number]  Osteoporosis Mother    Heart failure Father        Deceased in F03-11-22   Atrial fibrillation Father    Sick sinus syndrome Father    Heart disease Father    Diabetes Maternal Grandmother    Arthritis Maternal Grandfather    Dementia Maternal Grandfather    Cancer Paternal Grandmother    Heart attack Paternal Uncle    Stroke Paternal Aunt    Social History   Socioeconomic History   Marital status: Married    Spouse name: Not on file   Number of children: Not on file   Years of education: Not on file   Highest education level: Not on file  Occupational History   Not on file  Tobacco Use   Smoking status: Never   Smokeless tobacco: Never  Vaping Use   Vaping Use: Never used  Substance and Sexual Activity   Alcohol use: Yes    Comment: generally 1 drink per month   Drug use: Never   Sexual activity: Not Currently  Other Topics  Concern   Not on file  Social History Narrative   Social History      Diet? Low carb/sugar      Do you drink/eat things with caffeine? Iced tea, chocolate      Marital status?   married          What year were you married? 1977      Do you live in a house, apartment, assisted living, condo, trailer, etc.? house      Is it one or more stories? 2      How many persons live in your home? 2      Do you have any pets in your home? (please list) 2 dogs      Highest level of education completed? Masters degree      Current or past profession: Museum/gallery curator, Microbiologist      Do you exercise?         Very little                             Type & how often? Walk, some weights      Advanced Directives      Do you have a living will? no      Do you have a DNR form?    no                              If not, do you want to  discuss one? No, not now      Do you have signed POA/HPOA for forms? no      Functional Status      Do you have difficulty bathing or dressing yourself? no      Do you have difficulty preparing food or eating? no      Do you have difficulty managing your medications? no      Do you have difficulty managing your finances? no      Do you have difficulty affording your medications? no       Social Determinants of Health   Financial Resource Strain: Not on file  Food Insecurity: Not on file  Transportation Needs: Not on file  Physical Activity: Not on file  Stress: Not on file  Social Connections: Not on file    Tobacco Counseling Counseling given: Not Answered   Clinical Intake:  Pre-visit preparation completed: Yes  Pain : No/denies pain     BMI - recorded: 29 Nutritional Status: BMI 25 -29 Overweight Nutritional Risks: None Diabetes: No  How often do you need to have someone help you when you read instructions, pamphlets, or other written materials from your doctor or pharmacy?: 1 - Never  [place] of Daily Living    01/02/2023    1:06 PM  In your present state of health, do you have any difficulty performing the following activities:  Hearing? 1  Vision? 0  Difficulty concentrating or making decisions? 1  Comment memory  Walking or climbing stairs? 1  Comment due to fracture  Dressing or bathing? 0  Doing errands, shopping? 0  Preparing Food and eating ? N  Using the Toilet? N  In the past six months, have you accidently leaked urine? Y  Do you have problems with loss of bowel control? N  Managing your Medications? N  Managing your Finances? N  Housekeeping or managing your Housekeeping? N    Patient Care Team: Lauree Chandler, NP as PCP - General (Geriatric Medicine) Hale Bogus., MD as Referring Physician (Gastroenterology) Marylynn Pearson, MD as Consulting Physician (Obstetrics and Gynecology) Bjorn Loser, MD as Consulting Physician (Urology) Jacelyn Pi, MD as Consulting Physician (Endocrinology)  Indicate any recent Medical Services you may have received from other than [facility] providers in the past year (date may be approximate).     Assessment:   This is a routine wellness examination for Mary Short.  Hearing/Vision screen No results found.  Dietary issues and exercise activities discussed: Current Exercise Habits: The patient does not participate in regular exercise at present   Goals Addressed   None    Depression Screen    01/02/2023   12:34 PM 11/28/2021    1:15 PM 11/18/2019   10:48 AM 11/09/2019   10:38 AM 05/18/2019   10:37 AM 10/06/2018    9:20 AM  PHQ 2/9 Scores  PHQ - 2 Score 0 0 0 0 0 0    Fall Risk    01/02/2023   12:34 PM 06/11/2022   10:05 AM 04/23/2022    3:21 PM 11/28/2021    1:16 PM 06/09/2021    9:08 AM  Fall Risk   Falls in the past year? 0 1 1 0 1  Number falls in past yr: 0 1 0 0 1  Injury with Fall? 0 1 0 0 0  Risk for fall due to : No Fall Risks Impaired balance/gait History of fall(s) No Fall Risks History of fall(s)  Follow up Falls evaluation completed Falls evaluation completed;Education provided;Falls prevention discussed Falls evaluation completed;Education provided;Falls prevention discussed Falls evaluation completed     FALL RISK PREVENTION PERTAINING TO THE HOME:  Any stairs in or around the home? Yes  If so, are there any without handrails? No  Home free of loose throw rugs in walkways, pet beds, electrical cords, etc? Yes  Adequate lighting in your home to reduce risk of falls? Yes   ASSISTIVE DEVICES UTILIZED TO PREVENT FALLS:  Life alert? No  Use of a cane, walker or w/c? No  Grab bars in the bathroom? No  Shower chair or bench in shower? Yes  Elevated toilet seat or a handicapped toilet? No   TIMED UP AND GO:  Was the test performed? No .   Cognitive Function:    06/09/2021    9:15 AM 11/09/2019   10:41 AM 11/03/2018    10:54 AM  MMSE - Mini Mental State Exam  Orientation to time '5 5 5  '$ Orientation to Place '5 5 5  '$ Registration '3 3 3  '$ Attention/ Calculation '5 5 5  '$ Recall '3 3 3  '$ Language- name 2 objects '2 2 2  '$ Language- repeat '1 1 1  '$ Language- follow 3 step command '3 3 3  '$ Language- read & follow direction '1 1 1  '$ Write a sentence '1 1 1  '$ Copy design '1 1 1  '$ Total score '30 30 30        '$ 11/28/2021    1:18 PM  6CIT Screen  What Year? 0 points  What month? 0 points  What time? 0 points  Count back from 20 0 points  Months in reverse 0 points  Repeat phrase 0 points  Total Score 0 points    Immunizations Immunization History  Administered Date(s) Administered   Fluad Quad(high Dose 65+) 10/05/2022   Influenza, High Dose Seasonal PF 10/06/2018, 10/01/2019  Influenza, Quadrivalent, Recombinant, Inj, Pf 09/08/2021   Influenza-Unspecified 09/30/2013, 10/29/2014, 12/17/2016, 08/31/2020   Moderna Covid-19 Vaccine Bivalent Booster 65yr & up 10/05/2022   PFIZER Comirnaty(Gray Top)Covid-19 Tri-Sucrose Vaccine 07/25/2021   PFIZER(Purple Top)SARS-COV-2 Vaccination 02/07/2020, 03/04/2020, 09/30/2020   Pfizer Covid-19 Vaccine Bivalent Booster 169yr& up 04/09/2022   Pneumococcal Conjugate-13 11/03/2018   Pneumococcal Polysaccharide-23 11/09/2019   Pneumococcal-Unspecified 08/31/2020   Tdap 10/29/2014   Unspecified SARS-COV-2 Vaccination 07/25/2021   Zoster Recombinat (Shingrix) 10/10/2018, 12/09/2018   Zoster, Live 08/31/2020    TDAP status: Up to date  Flu Vaccine status: Up to date  Pneumococcal vaccine status: Up to date  Covid-19 vaccine status: Information provided on how to obtain vaccines.   Qualifies for Shingles Vaccine? Yes   Zostavax completed No   Shingrix Completed?: Yes  Screening Tests Health Maintenance  Topic Date Due   COVID-19 Vaccine (8 - 2023-24 season) 11/30/2022   MAMMOGRAM  08/12/2023   COLONOSCOPY (Pts [id number] coverage will need to be confirmed)   10/09/2023   Medicare Annual Wellness (AWV)  01/03/2024   DTaP/Tdap/Td (2 - Td or Tdap) 10/29/2024   Pneumonia Vaccine 65+48ears old  Completed   INFLUENZA VACCINE  Completed   DEXA SCAN  Completed   Hepatitis C Screening  Completed   Zoster Vaccines- Shingrix  Completed   HPV VACCINES  Aged Out    Health Maintenance  Health Maintenance Due  Topic Date Due   COVID-19 Vaccine (8 - 2023-24 season) 11/30/2022    Colorectal cancer screening: Type of screening: Colonoscopy. Completed 2019. Repeat every 5 years  Mammogram status: Completed 2022. Repeat every year  Bone Density status: Completed 2021. Results reflect: Bone density results: OSTEOPENIA. Repeat every 2 years. Get through GYN  Lung Cancer Screening: (Low Dose CT Chest recommended if Age 35-57-80ars, 30 pack-year currently smoking OR have quit w/in 15years.) does not qualify.    Additional Screening:  Hepatitis C Screening: does qualify; Completed 2020  Vision Screening: Recommended annual ophthalmology exams for early detection of glaucoma and other disorders of the eye. Is the patient up to date with their annual eye exam?  No  Who is the provider or what is the name of the office in which the patient attends annual eye exams? [facility] If pt is not established with a provider, would they like to be referred to a provider to establish care?  no .   Dental Screening: Recommended annual dental exams for proper oral hygiene  Community Resource Referral / Chronic Care Management: CRR required this visit?  No   CCM required this visit?  No      Plan:     I have personally reviewed and noted the following in the patient's chart:   Medical and social history Use of alcohol, tobacco or illicit drugs  Current medications and supplements including opioid prescriptions. Patient is not currently taking opioid prescriptions. Functional ability and status Nutritional status Physical activity Advanced directives List of  other physicians Hospitalizations, surgeries, and ER visits in previous 12 months Vitals Screenings to include cognitive, depression, and falls Referrals and appointments  In addition, I have reviewed and discussed with patient certain preventive protocols, quality metrics, and best practice recommendations. A written personalized care plan for preventive services as well as general preventive health recommendations were provided to patient.     JesLauree ChandlerP   01/02/2023   Virtual Visit via Video Note  I connected with Mary Short 01/02/23 at  1:00 PM  EST by a video enabled telemedicine application and verified that I am speaking with the correct person using two identifiers.  Location: Patient: home Provider: [facility]   I discussed the limitations of evaluation and management by telemedicine and the availability of in person appointments. The patient expressed understanding and agreed to proceed.    I discussed the assessment and treatment plan with the patient. The patient was provided an opportunity to ask questions and all were answered. The patient agreed with the plan and demonstrated an understanding of the instructions.   The patient was advised to call back or seek an in-person evaluation if the symptoms worsen or if the condition fails to improve as anticipated.  I provided 15 minutes of non-face-to-face time during this encounter.  Carlos [employer]. Dewaine Oats, AGNP Avs printed and mailed.

## 2023-01-02 NOTE — Telephone Encounter (Signed)
[name], palma are scheduled for a virtual visit with your provider today.    Just as we do with appointments in the office, we must obtain your consent to participate.  Your consent will be active for this visit and any virtual visit you may have with one of our providers in the next 365 days.    If you have a MyChart account, I can also send a copy of this consent to you electronically.  All virtual visits are billed to your insurance company just like a traditional visit in the office.  As this is a virtual visit, video technology does not allow for your provider to perform a traditional examination.  This may limit your provider's ability to fully assess your condition.  If your provider identifies any concerns that need to be evaluated in person or the need to arrange testing such as labs, EKG, etc, we will make arrangements to do so.    Although advances in technology are sophisticated, we cannot ensure that it will always work on either your end or our end.  If the connection with a video visit is poor, we may have to switch to a telephone visit.  With either a video or telephone visit, we are not always able to ensure that we have a secure connection.   I need to obtain your verbal consent now.   Are you willing to proceed with your visit today?   Mary Short has provided verbal consent on 01/02/2023 for a virtual visit (video or telephone).   [name], [place] 01/02/2023  1:03 PM

## 2023-03-06 ENCOUNTER — Other Ambulatory Visit: Payer: Self-pay | Admitting: Nurse Practitioner

## 2023-03-06 DIAGNOSIS — N3281 Overactive bladder: Secondary | ICD-10-CM

## 2023-03-15 ENCOUNTER — Ambulatory Visit (INDEPENDENT_AMBULATORY_CARE_PROVIDER_SITE_OTHER): Payer: PPO | Admitting: Nurse Practitioner

## 2023-03-15 ENCOUNTER — Encounter: Payer: Self-pay | Admitting: Nurse Practitioner

## 2023-03-15 VITALS — BP 112/78 | HR 83 | Temp 97.8°F | Resp 17 | Ht 65.5 in | Wt 180.4 lb

## 2023-03-15 DIAGNOSIS — K219 Gastro-esophageal reflux disease without esophagitis: Secondary | ICD-10-CM

## 2023-03-15 DIAGNOSIS — N3281 Overactive bladder: Secondary | ICD-10-CM

## 2023-03-15 DIAGNOSIS — E782 Mixed hyperlipidemia: Secondary | ICD-10-CM | POA: Diagnosis not present

## 2023-03-15 DIAGNOSIS — M810 Age-related osteoporosis without current pathological fracture: Secondary | ICD-10-CM

## 2023-03-15 DIAGNOSIS — J302 Other seasonal allergic rhinitis: Secondary | ICD-10-CM

## 2023-03-15 NOTE — Progress Notes (Signed)
Careteam: Patient Care Team: Lauree Chandler, NP as PCP - General (Geriatric Medicine) Hale Bogus., MD as Referring Physician (Gastroenterology) Marylynn Pearson, MD as Consulting Physician (Obstetrics and Gynecology) Bjorn Loser, MD as Consulting Physician (Urology) Jacelyn Pi, MD as Consulting Physician (Endocrinology)  PLACE OF SERVICE:  [facility] Directive information Does Patient Have a Medical Advance Directive?: Yes, Type of Advance Directive: Living will;Healthcare Power of Attorney, Does patient want to make changes to medical advance directive?: No - Patient declined  Allergies  Allergen Reactions   Fish Allergy     Any fish in the flounder family causes rash, difficulty breathing, swelling of eyes, itching, water/swelling under eyes   Penicillins     Swelling of the lips and a rash   Tape     Localized rash    Chief Complaint  Patient presents with   Medical Management of Chronic Issues    9 month follow-up. Discuss need for additional covid boosters or post pone if patient refuses or is not a candidate.      HPI: Patient is a 72 y.o. female for routine follow up.  She just got back from [place].  She is doing well.    Osteoporosis- managed by GYN, she is taking cal and vit d  Seasonal allergies- doing well on xyzal- she is taking year round and not had any problems.   GERD- using protonix daily which is helping her symptoms   OAB- continues on detrol- continues to have dry throat but better than what she was on previously  Hyperlipidemia- continues to work on low fat diet. Continues on lipitor   Eating better and now doing water aerobics   Continues to have aches and pains.  She was going to chiropractor for her neck pain and had exercises to help.  Slight tear in her meniscus- needs to get strength up Not needing to take medication for pain- has tylenol if needed    Review of Systems:  Review of Systems   Constitutional:  Negative for chills, fever and weight loss.  HENT:  Negative for tinnitus.   Respiratory:  Negative for cough, sputum production and shortness of breath.   Cardiovascular:  Negative for chest pain, palpitations and leg swelling.  Gastrointestinal:  Negative for abdominal pain, constipation, diarrhea and heartburn.  Genitourinary:  Negative for dysuria, frequency and urgency.  Musculoskeletal:  Negative for back pain, falls, joint pain and myalgias.  Skin: Negative.   Neurological:  Negative for dizziness and headaches.  Psychiatric/Behavioral:  Negative for depression and memory loss. The patient does not have insomnia.     Past Medical History:  Diagnosis Date   Hyperlipidemia    Osteoporosis    Seasonal allergies    Past Surgical History:  Procedure Laterality Date   BASAL CELL CARCINOMA EXCISION     hysterectomy     Dr. Bartolo Darter   WISDOM TOOTH EXTRACTION  1974   Social History:   reports that she has never smoked. She has never used smokeless tobacco. She reports current alcohol use. She reports that she does not use drugs.  Family History  Problem Relation Age of Onset   Dementia Mother 63   Osteoporosis Mother    Heart failure Father        Deceased in 03/01/2021    Atrial fibrillation Father    Sick sinus syndrome Father    Heart disease Father    Diabetes Maternal Grandmother    Arthritis  Maternal Grandfather    Dementia Maternal Grandfather    Cancer Paternal Grandmother    Heart attack Paternal Uncle    Stroke Paternal Aunt     Medications: Patient's Medications  New Prescriptions   No medications on file  Previous Medications   ACETAMINOPHEN (TYLENOL) 500 MG TABLET    Take 1,000 mg by mouth as needed.   ATORVASTATIN (LIPITOR) 20 MG TABLET    TAKE 1 TABLET BY MOUTH EVERY DAY   LEVOCETIRIZINE (XYZAL) 5 MG TABLET    Take 5 mg by mouth every evening.   MULTIPLE VITAMIN (MULTI-VITAMIN) TABLET    Take 1 tablet by mouth daily.   OMEGA-3  FATTY ACIDS (FISH OIL PO)    Take by mouth daily.   PANTOPRAZOLE (PROTONIX) 40 MG TABLET    TAKE 1 TABLET BY MOUTH EVERY DAY   TOLTERODINE (DETROL) 2 MG TABLET    TAKE 1 TABLET (2 MG TOTAL) BY MOUTH 2 (TWO) TIMES DAILY. NEEDS AN APPOINTMENT BEFORE ANYMORE FUTURE REFILLS.  Modified Medications   No medications on file  Discontinued Medications   MULTIPLE MINERALS-VITAMINS (CITRACAL MAXIMUM PLUS) TABS    Take 2 tablets by mouth daily at 12 noon.    Physical Exam:  Vitals:   03/15/23 1257  BP: 112/78  Pulse: 83  Resp: 17  Temp: 97.8 F (36.6 C)  TempSrc: Temporal  SpO2: 96%  Weight: 180 lb 6.4 oz (81.8 kg)  Height: 5' 5.5" (1.664 m)   Body mass index is 29.56 kg/m. Wt Readings from Last 3 Encounters:  03/15/23 180 lb 6.4 oz (81.8 kg)  06/11/22 177 lb (80.3 kg)  04/23/22 178 lb (80.7 kg)    Physical Exam Constitutional:      General: She is not in acute distress.    Appearance: She is well-developed. She is not diaphoretic.  HENT:     Head: Normocephalic and atraumatic.     Mouth/Throat:     Pharynx: No oropharyngeal exudate.  Eyes:     Conjunctiva/sclera: Conjunctivae normal.     Pupils: Pupils are equal, round, and reactive to light.  Cardiovascular:     Rate and Rhythm: Normal rate and regular rhythm.     Heart sounds: Normal heart sounds.  Pulmonary:     Effort: Pulmonary effort is normal.     Breath sounds: Normal breath sounds.  Abdominal:     General: Bowel sounds are normal.     Palpations: Abdomen is soft.  Musculoskeletal:     Cervical back: Normal range of motion and neck supple.     Right lower leg: No edema.     Left lower leg: No edema.  Skin:    General: Skin is warm and dry.  Neurological:     Mental Status: She is alert.  Psychiatric:        Mood and Affect: Mood normal.     Labs reviewed: Basic Metabolic Panel: Recent Labs    06/07/22 0909  NA 142  K 4.1  CL 108  CO2 28  GLUCOSE 91  BUN 19  CREATININE 0.84  CALCIUM 8.9    Liver Function Tests: Recent Labs    06/07/22 0909  AST 30  ALT 32*  BILITOT 0.4  PROT 6.3   No results for input(s): "LIPASE", "AMYLASE" in the last 8760 hours. No results for input(s): "AMMONIA" in the last 8760 hours. CBC: Recent Labs    06/07/22 0909  WBC 3.2*  NEUTROABS 1,811  HGB 13.8  HCT 40.7  MCV 93.8  PLT 189   Lipid Panel: Recent Labs    06/07/22 0909  CHOL 193  HDL 99  LDLCALC 80  TRIG 60  CHOLHDL 1.9   TSH: No results for input(s): "TSH" in the last 8760 hours. A1C: Lab Results  Component Value Date   HGBA1C 5.2 05/13/2018     Assessment/Plan 1. Age-related osteoporosis without current pathological fracture -Recommended to take calcium 600 mg twice daily with Vitamin D 2000 units daily and weight bearing activity 30 mins/5 days a week - COMPLETE METABOLIC PANEL WITH GFR  2. Overactive bladder Stable on detrol  3. Mixed hyperlipidemia -continue dietary modifications - Lipid panel - COMPLETE METABOLIC PANEL WITH GFR - CBC with Differential/Platelet  4. Gastroesophageal reflux disease without esophagitis Much better on protonix daily  5. Seasonal allergies Stable on xzyal.    Return in about 6 months (around 09/15/2023) for routine follow up .  Carlos American. [place], [facility] [phone]

## 2023-03-16 LAB — COMPLETE METABOLIC PANEL WITH GFR
AG Ratio: 1.9 (calc) (ref 1.0–2.5)
ALT: 20 U/L (ref 6–29)
AST: 22 U/L (ref 10–35)
Albumin: 4.6 g/dL (ref 3.6–5.1)
Alkaline phosphatase (APISO): 54 U/L (ref 37–153)
BUN: 21 mg/dL (ref 7–25)
CO2: 28 mmol/L (ref 20–32)
Calcium: 9.7 mg/dL (ref 8.6–10.4)
Chloride: 104 mmol/L (ref 98–110)
Creat: 0.84 mg/dL (ref 0.50–1.05)
Globulin: 2.4 g/dL (calc) (ref 1.9–3.7)
Glucose, Bld: 86 mg/dL (ref 65–99)
Potassium: 4.2 mmol/L (ref 3.5–5.3)
Sodium: 143 mmol/L (ref 135–146)
Total Bilirubin: 0.5 mg/dL (ref 0.2–1.2)
Total Protein: 7 g/dL (ref 6.1–8.1)
eGFR: 75 mL/min/{1.73_m2} (ref >=60)

## 2023-03-16 LAB — CBC WITH DIFFERENTIAL/PLATELET
Absolute Monocytes: 285 cells/uL (ref 200–950)
Basophils Absolute: 10 cells/uL (ref 0–200)
Basophils Relative: 0.3 %
Eosinophils Absolute: 51 cells/uL (ref 15–500)
Eosinophils Relative: 1.6 %
HCT: 43.3 % (ref 35.0–45.0)
Hemoglobin: 14.8 g/dL (ref 11.7–15.5)
Lymphs Abs: 870 cells/uL (ref 850–3900)
MCH: 30.8 pg (ref 27.0–33.0)
MCHC: 34.2 g/dL (ref 32.0–36.0)
MCV: 90 fL (ref 80.0–100.0)
MPV: 8.9 fL (ref 7.5–12.5)
Monocytes Relative: 8.9 %
Neutro Abs: 1984 cells/uL (ref 1500–7800)
Neutrophils Relative %: 62 %
Platelets: 236 10*3/uL (ref 140–400)
RBC: 4.81 10*6/uL (ref 3.80–5.10)
RDW: 12.1 % (ref 11.0–15.0)
Total Lymphocyte: 27.2 %
WBC: 3.2 10*3/uL — ABNORMAL LOW (ref 3.8–10.8)

## 2023-03-16 LAB — LIPID PANEL
Cholesterol: 190 mg/dL (ref <200)
HDL: 93 mg/dL (ref >=50)
LDL Cholesterol (Calc): 83 mg/dL (calc)
Non-HDL Cholesterol (Calc): 97 mg/dL (calc) (ref <130)
Total CHOL/HDL Ratio: 2 (calc) (ref <5.0)
Triglycerides: 67 mg/dL (ref <150)

## 2023-05-02 DIAGNOSIS — M25562 Pain in left knee: Secondary | ICD-10-CM | POA: Diagnosis not present

## 2023-05-02 DIAGNOSIS — S83242D Other tear of medial meniscus, current injury, left knee, subsequent encounter: Secondary | ICD-10-CM | POA: Diagnosis not present

## 2023-06-04 ENCOUNTER — Other Ambulatory Visit: Payer: Self-pay | Admitting: Nurse Practitioner

## 2023-06-04 DIAGNOSIS — E782 Mixed hyperlipidemia: Secondary | ICD-10-CM

## 2023-06-04 DIAGNOSIS — N3281 Overactive bladder: Secondary | ICD-10-CM

## 2023-07-14 ENCOUNTER — Other Ambulatory Visit: Payer: Self-pay | Admitting: Adult Health

## 2023-07-14 DIAGNOSIS — R059 Cough, unspecified: Secondary | ICD-10-CM

## 2023-08-31 ENCOUNTER — Other Ambulatory Visit: Payer: Self-pay | Admitting: Nurse Practitioner

## 2023-08-31 DIAGNOSIS — N3281 Overactive bladder: Secondary | ICD-10-CM

## 2023-09-09 ENCOUNTER — Ambulatory Visit (INDEPENDENT_AMBULATORY_CARE_PROVIDER_SITE_OTHER): Payer: PPO | Admitting: Nurse Practitioner

## 2023-09-09 ENCOUNTER — Encounter: Payer: Self-pay | Admitting: Nurse Practitioner

## 2023-09-09 VITALS — BP 126/74 | HR 76 | Temp 97.1°F | Ht 65.5 in | Wt 174.0 lb

## 2023-09-09 DIAGNOSIS — E663 Overweight: Secondary | ICD-10-CM

## 2023-09-09 DIAGNOSIS — K219 Gastro-esophageal reflux disease without esophagitis: Secondary | ICD-10-CM

## 2023-09-09 DIAGNOSIS — J302 Other seasonal allergic rhinitis: Secondary | ICD-10-CM

## 2023-09-09 DIAGNOSIS — E782 Mixed hyperlipidemia: Secondary | ICD-10-CM

## 2023-09-09 DIAGNOSIS — Z1231 Encounter for screening mammogram for malignant neoplasm of breast: Secondary | ICD-10-CM

## 2023-09-09 DIAGNOSIS — N3281 Overactive bladder: Secondary | ICD-10-CM | POA: Diagnosis not present

## 2023-09-09 DIAGNOSIS — M810 Age-related osteoporosis without current pathological fracture: Secondary | ICD-10-CM

## 2023-09-09 NOTE — Progress Notes (Signed)
Careteam: Patient Care Team: Sharon Seller, NP as PCP - General (Geriatric Medicine) Sydnee Cabal., MD as Referring Physician (Gastroenterology) Zelphia Cairo, MD as Consulting Physician (Obstetrics and Gynecology) Alfredo Martinez, MD as Consulting Physician (Urology) Dorisann Frames, MD as Consulting Physician (Endocrinology)  PLACE OF SERVICE:  [facility]  Advanced Directive information Does Patient Have a Medical Advance Directive?: Yes, Type of Advance Directive: Healthcare Power of [place];Living will, Does patient want to make changes to medical advance directive?: No - Patient declined  Allergies  Allergen Reactions   Fish Allergy     Any fish in the flounder family causes rash, difficulty breathing, swelling of eyes, itching, water/swelling under eyes   Penicillins     Swelling of the lips and a rash   Tape     Localized rash    Chief Complaint  Patient presents with   Medical Management of Chronic Issues    6 month follow-up. Discuss need for mammogram.      HPI: Patient is a 72 y.o. female presents for 32-month follow-up.  States she's having her "normal back issues," sees the chiropractor, needs to follow up with neurosurgery. Sometimes she can't sleep due to pain. Had to take tramadol last night. She is okay right now but certain activities like sewing can exacerbate problem. Uses heat to help relief the pain as well.   She is eating/drinking okay, no issues with GERD. States she is trying to lose weight without success. Has only lost about 7 pounds in the last 6 months. She has cut out sodium, is not eating out fast food as much. She eats chicken and fish mainly, very limited red meat intake. Most of her meals consist of salads and fruits. She is also walking 1-2 miles per day, as well as doing water aerobics 3x/week. She is wondering if she should start medications to aid in weight loss.   Review of Systems:  Review of Systems  Constitutional:   Negative for chills, fever and weight loss.  Respiratory:  Negative for cough and shortness of breath.   Cardiovascular:  Negative for chest pain, palpitations and leg swelling.  Gastrointestinal:  Negative for abdominal pain, blood in stool, constipation, diarrhea, heartburn, nausea and vomiting.  Genitourinary:  Positive for frequency. Negative for dysuria and hematuria.  Musculoskeletal:  Positive for back pain.       Stiff neck  Neurological:  Negative for dizziness, weakness and headaches.  Psychiatric/Behavioral:  Negative for depression. The patient is not nervous/anxious and does not have insomnia.     Past Medical History:  Diagnosis Date   Hyperlipidemia    Osteoporosis    Seasonal allergies    Past Surgical History:  Procedure Laterality Date   BASAL CELL CARCINOMA EXCISION     hysterectomy     Dr. Lamonte Sakai   WISDOM TOOTH EXTRACTION  1974   Social History:   reports that she has never smoked. She has never used smokeless tobacco. She reports current alcohol use. She reports that she does not use drugs.  Family History  Problem Relation Age of Onset   Dementia Mother 45   Osteoporosis Mother    Heart failure Father        Deceased in 02-21-2021   Atrial fibrillation Father    Sick sinus syndrome Father    Heart disease Father    Diabetes Maternal Grandmother    Arthritis Maternal Grandfather    Dementia Maternal Grandfather  Cancer Paternal Grandmother    Heart attack Paternal Uncle    Stroke Paternal Aunt     Medications: Patient's Medications  New Prescriptions   No medications on file  Previous Medications   ACETAMINOPHEN (TYLENOL) 500 MG TABLET    Take 1,000 mg by mouth as needed.   ATORVASTATIN (LIPITOR) 20 MG TABLET    TAKE 1 TABLET BY MOUTH EVERY DAY   LEVOCETIRIZINE (XYZAL) 5 MG TABLET    Take 5 mg by mouth every evening.   MULTIPLE VITAMIN (MULTI-VITAMIN) TABLET    Take 1 tablet by mouth daily.   OMEGA-3 FATTY ACIDS (FISH OIL PO)    Take  by mouth daily.   PANTOPRAZOLE (PROTONIX) 40 MG TABLET    TAKE 1 TABLET BY MOUTH EVERY DAY   TOLTERODINE (DETROL) 2 MG TABLET    TAKE 1 TABLET BY MOUTH 2 TIMES DAILY.  Modified Medications   No medications on file  Discontinued Medications   No medications on file    Physical Exam:  Vitals:   09/09/23 1027  BP: 126/74  Pulse: 76  Temp: (!) 97.1 F (36.2 C)  TempSrc: Temporal  SpO2: 97%  Weight: 78.9 kg  Height: 5' 5.5" (1.664 m)   Body mass index is 28.51 kg/m. Wt Readings from Last 3 Encounters:  09/09/23 174 lb (78.9 kg)  03/15/23 180 lb 6.4 oz (81.8 kg)  06/11/22 177 lb (80.3 kg)    Physical Exam Constitutional:      Appearance: Normal appearance.  Cardiovascular:     Rate and Rhythm: Normal rate and regular rhythm.     Pulses: Normal pulses.     Heart sounds: Normal heart sounds.  Pulmonary:     Effort: Pulmonary effort is normal.     Breath sounds: Normal breath sounds.  Abdominal:     General: Bowel sounds are normal.     Palpations: Abdomen is soft.  Skin:    General: Skin is warm and dry.  Neurological:     General: No focal deficit present.     Mental Status: She is alert and oriented to person, place, and time. Mental status is at baseline.  Psychiatric:        Mood and Affect: Mood normal.        Behavior: Behavior normal.     Labs reviewed: Basic Metabolic Panel: Recent Labs    03/15/23 1326  NA 143  K 4.2  CL 104  CO2 28  GLUCOSE 86  BUN 21  CREATININE 0.84  CALCIUM 9.7   Liver Function Tests: Recent Labs    03/15/23 1326  AST 22  ALT 20  BILITOT 0.5  PROT 7.0   No results for input(s): "LIPASE", "AMYLASE" in the last 8760 hours. No results for input(s): "AMMONIA" in the last 8760 hours. CBC: Recent Labs    03/15/23 1326  WBC 3.2*  NEUTROABS 1,984  HGB 14.8  HCT 43.3  MCV 90.0  PLT 236   Lipid Panel: Recent Labs    03/15/23 1326  CHOL 190  HDL 93  LDLCALC 83  TRIG 67  CHOLHDL 2.0   TSH: No results for  input(s): "TSH" in the last 8760 hours. A1C: Lab Results  Component Value Date   HGBA1C 5.2 05/13/2018     Assessment/Plan 1. Age-related osteoporosis without current pathological fracture -Continue weight bearing exercises -Continue vit D/calcium  2. Overactive bladder -Stable on tolterodine/Detrol  3. Mixed hyperlipidemia -Continue atorvastatin/Lipitor -Encouraged dietary modifications - CBC with Differential/Platelet - Complete  Metabolic Panel with eGFR - Lipid panel  4. Gastroesophageal reflux disease without esophagitis -Continue pantoprazole/Protonix -Encouraged dietary modifications - CBC with Differential/Platelet  5. Seasonal allergies -Controlled; continue levocetirizine/Xyzal - CBC with Differential/Platelet  6. Overweight (BMI 25.0-29.9) -Continue dietary modifications and physical activity -Cut out sweets and fried foods  7. Encounter for screening mammogram for malignant neoplasm of breast - [facility] Digital Diagnostic Bilat; Future    Return in about 6 months (around 03/08/2024) for routine follow up .  Rollen Sox, FNP-MSN Student -I personally was present during the history, physical exam and medical decision-making activities of this service and have verified that the service and findings are accurately documented in the student's note Abbey Chatters, NP

## 2023-09-10 LAB — CBC WITH DIFFERENTIAL/PLATELET
Absolute Monocytes: 346 {cells}/uL (ref 200–950)
Basophils Absolute: 19 {cells}/uL (ref 0–200)
Basophils Relative: 0.5 %
Eosinophils Absolute: 91 {cells}/uL (ref 15–500)
Eosinophils Relative: 2.4 %
HCT: 42 % (ref 35.0–45.0)
Hemoglobin: 14.2 g/dL (ref 11.7–15.5)
Lymphs Abs: 1083 {cells}/uL (ref 850–3900)
MCH: 31.4 pg (ref 27.0–33.0)
MCHC: 33.8 g/dL (ref 32.0–36.0)
MCV: 92.9 fL (ref 80.0–100.0)
MPV: 9 fL (ref 7.5–12.5)
Monocytes Relative: 9.1 %
Neutro Abs: 2261 {cells}/uL (ref 1500–7800)
Neutrophils Relative %: 59.5 %
Platelets: 208 10*3/uL (ref 140–400)
RBC: 4.52 10*6/uL (ref 3.80–5.10)
RDW: 11.9 % (ref 11.0–15.0)
Total Lymphocyte: 28.5 %
WBC: 3.8 10*3/uL (ref 3.8–10.8)

## 2023-09-10 LAB — COMPLETE METABOLIC PANEL WITH GFR
AG Ratio: 2 (calc) (ref 1.0–2.5)
ALT: 20 U/L (ref 6–29)
AST: 20 U/L (ref 10–35)
Albumin: 4.5 g/dL (ref 3.6–5.1)
Alkaline phosphatase (APISO): 56 U/L (ref 37–153)
BUN: 19 mg/dL (ref 7–25)
CO2: 30 mmol/L (ref 20–32)
Calcium: 9.8 mg/dL (ref 8.6–10.4)
Chloride: 103 mmol/L (ref 98–110)
Creat: 0.94 mg/dL (ref 0.60–1.00)
Globulin: 2.2 g/dL (ref 1.9–3.7)
Glucose, Bld: 92 mg/dL (ref 65–99)
Potassium: 4.3 mmol/L (ref 3.5–5.3)
Sodium: 141 mmol/L (ref 135–146)
Total Bilirubin: 0.6 mg/dL (ref 0.2–1.2)
Total Protein: 6.7 g/dL (ref 6.1–8.1)
eGFR: 65 mL/min/{1.73_m2} (ref >=60)

## 2023-09-10 LAB — LIPID PANEL
Cholesterol: 171 mg/dL (ref <200)
HDL: 81 mg/dL (ref >=50)
LDL Cholesterol (Calc): 74 mg/dL
Non-HDL Cholesterol (Calc): 90 mg/dL (ref <130)
Total CHOL/HDL Ratio: 2.1 (calc) (ref <5.0)
Triglycerides: 84 mg/dL (ref <150)

## 2023-09-23 ENCOUNTER — Encounter: Payer: Self-pay | Admitting: Nurse Practitioner

## 2023-09-23 NOTE — Telephone Encounter (Signed)
Can you make a contract for her for tramadol 50 mg once daily as needed for severe pain #15 and then place at front for her to sign?

## 2023-09-24 ENCOUNTER — Telehealth: Payer: Self-pay

## 2023-09-24 DIAGNOSIS — M542 Cervicalgia: Secondary | ICD-10-CM

## 2023-09-24 MED ORDER — TRAMADOL HCL 50 MG PO TABS
50.0000 mg | ORAL_TABLET | Freq: Every day | ORAL | 0 refills | Status: AC | PRN
Start: 2023-09-24 — End: ?

## 2023-09-24 NOTE — Telephone Encounter (Signed)
Patient stopped by the office with a signed contract for Tramadol 50 mg, 1 by mouth as needed (refer to recent mychart message)  Please associate and sign rx

## 2023-09-24 NOTE — Telephone Encounter (Signed)
Rx signed and sent to pharmacy on file.

## 2023-09-24 NOTE — Telephone Encounter (Signed)
Patient aware rx sent

## 2023-09-25 ENCOUNTER — Telehealth: Payer: PPO

## 2023-09-25 NOTE — Telephone Encounter (Signed)
Incoming fax received from [facility] stating alternative requested to Tramadol with the notation" Limits exceeded for initial fill

## 2023-09-27 NOTE — Telephone Encounter (Signed)
Outgoing call placed to [facility] to inquire about message. [facility] stated patient paid out of pocket for rx and picked up. No further action is needed at this time

## 2023-10-14 DIAGNOSIS — K5730 Diverticulosis of large intestine without perforation or abscess without bleeding: Secondary | ICD-10-CM | POA: Diagnosis not present

## 2023-10-14 DIAGNOSIS — K640 First degree hemorrhoids: Secondary | ICD-10-CM | POA: Diagnosis not present

## 2023-10-14 DIAGNOSIS — Z1211 Encounter for screening for malignant neoplasm of colon: Secondary | ICD-10-CM | POA: Diagnosis not present

## 2023-10-14 DIAGNOSIS — Z860100 Personal history of colon polyps, unspecified: Secondary | ICD-10-CM | POA: Diagnosis not present

## 2023-10-14 LAB — HM COLONOSCOPY

## 2023-10-16 DIAGNOSIS — M4722 Other spondylosis with radiculopathy, cervical region: Secondary | ICD-10-CM | POA: Diagnosis not present

## 2023-10-16 DIAGNOSIS — Z6829 Body mass index (BMI) 29.0-29.9, adult: Secondary | ICD-10-CM | POA: Diagnosis not present

## 2023-10-16 DIAGNOSIS — M546 Pain in thoracic spine: Secondary | ICD-10-CM | POA: Diagnosis not present

## 2023-11-25 ENCOUNTER — Ambulatory Visit
Admission: RE | Admit: 2023-11-25 | Discharge: 2023-11-25 | Disposition: A | Discharge status: Home / Self Care | Payer: PPO | Source: Ambulatory Visit | Attending: Nurse Practitioner | Admitting: Nurse Practitioner

## 2023-11-25 DIAGNOSIS — Z1231 Encounter for screening mammogram for malignant neoplasm of breast: Secondary | ICD-10-CM | POA: Diagnosis not present

## 2023-11-29 ENCOUNTER — Other Ambulatory Visit: Payer: Self-pay | Admitting: Nurse Practitioner

## 2023-11-29 DIAGNOSIS — E782 Mixed hyperlipidemia: Secondary | ICD-10-CM

## 2023-12-02 ENCOUNTER — Other Ambulatory Visit: Payer: Self-pay

## 2023-12-02 ENCOUNTER — Ambulatory Visit: Payer: PPO | Attending: Neurological Surgery

## 2023-12-02 DIAGNOSIS — M546 Pain in thoracic spine: Secondary | ICD-10-CM | POA: Diagnosis not present

## 2023-12-02 DIAGNOSIS — M25611 Stiffness of right shoulder, not elsewhere classified: Secondary | ICD-10-CM | POA: Diagnosis not present

## 2023-12-02 DIAGNOSIS — M2560 Stiffness of unspecified joint, not elsewhere classified: Secondary | ICD-10-CM | POA: Insufficient documentation

## 2023-12-02 DIAGNOSIS — G8929 Other chronic pain: Secondary | ICD-10-CM | POA: Insufficient documentation

## 2023-12-02 DIAGNOSIS — R6889 Other general symptoms and signs: Secondary | ICD-10-CM | POA: Insufficient documentation

## 2023-12-02 NOTE — Therapy (Signed)
OUTPATIENT PHYSICAL THERAPY THORACOLUMBAR EVALUATION   Patient Name: Mary Short MRN: [id number] DOB:[birth year], 72 y.o., female Today's Date: 12/02/2023  END OF SESSION:  PT End of Session - 12/02/23 1613     Visit Number 1    Date for PT Re-Evaluation 12/30/23    PT Start Time 1345    PT Stop Time 1430    PT Time Calculation (min) 45 min    Activity Tolerance Patient tolerated treatment well    Behavior During Therapy [facility] for tasks assessed/performed             Past Medical History:  Diagnosis Date   Hyperlipidemia    Osteoporosis    Seasonal allergies    Past Surgical History:  Procedure Laterality Date   BASAL CELL CARCINOMA EXCISION     hysterectomy     Dr. Lamonte Sakai   WISDOM TOOTH EXTRACTION  1974   Patient Active Problem List   Diagnosis Date Noted   Osteoporosis 10/06/2018   Hyperlipidemia 10/06/2018   Overactive bladder 10/06/2018   Seasonal allergies 10/06/2018    PCP: Abbey Chatters, NP  REFERRING PROVIDER: Monia Pouch, DO  REFERRING DIAG: chronic mid thoracic pain  Rationale for Evaluation and Treatment: Rehabilitation  THERAPY DIAG:  Chronic midline thoracic back pain - Plan: PT plan of care cert/re-cert  Joint stiffness of spine - Plan: PT plan of care cert/re-cert  Decreased range of motion of right shoulder - Plan: PT plan of care cert/re-cert  Decreased functional activity tolerance - Plan: PT plan of care cert/re-cert  ONSET DATE: 11/05/23  SUBJECTIVE:                                                                                                                                                                                           SUBJECTIVE STATEMENT: Has had chronic cervical spine pain the thoracic spine pain has been going on for 20 years, has gotten worse over the last year, almost becomes incapacitated and has to lie on her back on the floor with feet up to stop the spasms  PERTINENT HISTORY:  Referred by  neurologist, has chronic mid back pain radiating to R lateral chest wall with forward bending and reaching with R UE  PAIN:  Are you having pain? Yes: NPRS scale: 0 to 7/10 Pain location: between shoulder blades Pain description: starts with burning, then progresses to cramping Aggravating factors: standing and slightly bending forward, such as with sewing, also using arms for tasks like chopping vegetables Relieving factors: lie on back with feet on bed or sofa  PRECAUTIONS: Other: osteoporosis  RED FLAGS: None   WEIGHT BEARING  RESTRICTIONS: No  FALLS:  Has patient fallen in last 6 months? No  LIVING ENVIRONMENT: Lives with: lives with their family and lives with their spouse Lives in: House/apartment   OCCUPATION: retired  PLOF: Independent  PATIENT GOALS: be able to reduce the frequency and intensity of episodes of mid thoracic pain   NEXT MD VISIT: over 3 months  OBJECTIVE:  Note: Objective measures were completed at Evaluation unless otherwise noted.  DIAGNOSTIC FINDINGS:  na  PATIENT SURVEYS:  Modified Oswestry 20/50 40%    SCREENING FOR RED FLAGS: Bowel or bladder incontinence: No Spinal tumors: No Cauda equina syndrome: No Compression fracture: No Abdominal aneurysm: No  COGNITION: Overall cognitive status: Within functional limits for tasks assessed     SENSATION: WFL  MUSCLE LENGTH: Hamstrings: Right nt deg; Left nt deg Thomas test: Right wfl deg; Left -10 deg  POSTURE: increased lordosis at TL junction, straightened upper thoracic spine, increased flexion B hips to 15 degrees Noted mild concavity R lumbar region, with elevated R iliac crest  PALPATION: Segmental testing with tender spinous processes T 4, T 6,7 to T 12/L1 Improved Sx with PA light overpressure over these sites  LUMBAR ROM: all WFL Thoracic spine: flex wfl, ext 20% Rotation 40% B  B shoulders flexion , abd -20 degrees of terminal ROM LOWER EXTREMITY ROM:   all  wfl  LOWER EXTREMITY MMT:  wfl  UE MMT: all wnl   LUMBAR SPECIAL TESTS:  Thomas test: PositiveL   GAIT: Distance walked: wnl, gait in clinic, no device  TODAY'S TREATMENT:                                                                                                                              DATE: 12/02/23:  Attempted various pain relieving stretches, positioning to traction her thoracic spine, including seated thoracic extension over back of chair,  Supine over therapeutic ball for pecs stretch, thoracic extension stretch Supine over 1/2 6" foam roller, with roller placed parallel to spine and with roller placed perpendicular  to spine , alternated with gentle LTR's to engage TL jxn and hip flexors   PATIENT EDUCATION:  Education details: POC, goals Person educated: Patient Education method: Explanation, Demonstration, Tactile cues, and Verbal cues Education comprehension: verbalized understanding, returned demonstration, verbal cues required, tactile cues required, and needs further education  HOME EXERCISE PROGRAM: See above, the most effective exercise for the patient for relieving pain was the stretching over the 1/2 foam roller.    ASSESSMENT:  CLINICAL IMPRESSION: Patient is a 72 y.o. female who was evaluated today by skilled physical therapy for chronic thoracic spine pain.  Objective findings included loss of ROM thoracic spine, mild loss of B shoulder elevation AROM, restricted L hip flexors, postural changes.  She responded well today to PA glides thoracic region, and the most effective exercise for her today to replicate this technique was with the 1/2 thoracic roll. She is only able to  attend 2 more sessions as she is going to [place] for 3 months to assist her daughter/housesit. She requests home strengthening, as her daughter has a home gym.  Will benefit from this brief duration of physical therapy intervention to address her thoracic spine pain and teach her an  effective home management program for pain , stretching, strengthening.  OBJECTIVE IMPAIRMENTS: decreased activity tolerance, decreased ROM, hypomobility, increased fascial restrictions, increased muscle spasms, impaired flexibility, impaired UE functional use, postural dysfunction, and pain.   ACTIVITY LIMITATIONS: carrying, lifting, standing, bathing, dressing, and reach over head  PARTICIPATION LIMITATIONS: meal prep, cleaning, laundry, community activity, and yard work  PERSONAL FACTORS: Age, Behavior pattern, Fitness, Past/current experiences, and 1-2 comorbidities: chronic c spine pain,   are also affecting patient's functional outcome.   REHAB POTENTIAL: Good  CLINICAL DECISION MAKING: Stable/uncomplicated  EVALUATION COMPLEXITY: Low   GOALS: Goals reviewed with patient? Yes   LONG TERM GOALS: Target date: 4 weeks, 12/30/23  I HEP, able to comprehend, progress with her strengthening for thoracic region. Baseline: initiated Goal status: INITIAL  2.  Improve thoracic spine extension to 75% from 40% Baseline:  Goal status: INITIAL  3.  Improve B shoulder flex, abd to 165 B Baseline: 140 Goal status: INITIAL   PLAN:  PT FREQUENCY: 1-2x/week  PT DURATION: 4 weeks  PLANNED INTERVENTIONS: 97110-Therapeutic exercises, 97530- Therapeutic activity, [id number]- Neuromuscular re-education, 97535- Self Care, [id number]- Manual therapy, and Spinal mobilization.  PLAN FOR NEXT SESSION: instruct in UE, periscapular strengthening with gym equipment to replicate her daughters home gym , TPDN was requested by referring MD , may attempt next visit if Otelia Santee available   Early Chars, PT, DPT, OCS 12/02/2023, 4:56 PM

## 2023-12-09 ENCOUNTER — Encounter: Payer: Self-pay | Admitting: Physical Therapy

## 2023-12-09 ENCOUNTER — Ambulatory Visit: Payer: PPO | Admitting: Physical Therapy

## 2023-12-09 DIAGNOSIS — G8929 Other chronic pain: Secondary | ICD-10-CM

## 2023-12-09 DIAGNOSIS — R6889 Other general symptoms and signs: Secondary | ICD-10-CM

## 2023-12-09 DIAGNOSIS — M2560 Stiffness of unspecified joint, not elsewhere classified: Secondary | ICD-10-CM

## 2023-12-09 DIAGNOSIS — M25611 Stiffness of right shoulder, not elsewhere classified: Secondary | ICD-10-CM

## 2023-12-09 DIAGNOSIS — M546 Pain in thoracic spine: Secondary | ICD-10-CM | POA: Diagnosis not present

## 2023-12-09 NOTE — Therapy (Signed)
OUTPATIENT PHYSICAL THERAPY THORACOLUMBAR    Patient Name: Mary Short MRN: [id number] DOB:1951-12-16, 72 y.o., female Today's Date: 12/09/2023  END OF SESSION:  PT End of Session - 12/09/23 1516     Visit Number 2    Date for PT Re-Evaluation 12/30/23    PT Start Time 1515    PT Stop Time 1600    PT Time Calculation (min) 45 min    Activity Tolerance Patient tolerated treatment well    Behavior During Therapy [facility] for tasks assessed/performed             Past Medical History:  Diagnosis Date   Hyperlipidemia    Osteoporosis    Seasonal allergies    Past Surgical History:  Procedure Laterality Date   BASAL CELL CARCINOMA EXCISION     hysterectomy     Dr. Lamonte Sakai   WISDOM TOOTH EXTRACTION  1974   Patient Active Problem List   Diagnosis Date Noted   Osteoporosis 10/06/2018   Hyperlipidemia 10/06/2018   Overactive bladder 10/06/2018   Seasonal allergies 10/06/2018    PCP: Abbey Chatters, NP  REFERRING PROVIDER: Monia Pouch, DO  REFERRING DIAG: chronic mid thoracic pain  Rationale for Evaluation and Treatment: Rehabilitation  THERAPY DIAG:  Chronic midline thoracic back pain  Decreased range of motion of right shoulder  Joint stiffness of spine  Decreased functional activity tolerance  ONSET DATE: 11/05/23  SUBJECTIVE:                                                                                                                                                                                           SUBJECTIVE STATEMENT: Did pretty good, ordered half foam rolls  PERTINENT HISTORY:  Referred by neurologist, has chronic mid back pain radiating to R lateral chest wall with forward bending and reaching with R UE  PAIN:  Are you having pain? Yes: NPRS scale: 10/10 Pain location: between shoulder blades Pain description: starts with burning, then progresses to cramping Aggravating factors: standing and slightly bending forward, such as  with sewing, also using arms for tasks like chopping vegetables Relieving factors: lie on back with feet on bed or sofa  PRECAUTIONS: Other: osteoporosis  RED FLAGS: None   WEIGHT BEARING RESTRICTIONS: No  FALLS:  Has patient fallen in last 6 months? No  LIVING ENVIRONMENT: Lives with: lives with their family and lives with their spouse Lives in: House/apartment   OCCUPATION: retired  PLOF: Independent  PATIENT GOALS: be able to reduce the frequency and intensity of episodes of mid thoracic pain   NEXT MD VISIT: over 3 months  OBJECTIVE:  Note: Objective  measures were completed at Evaluation unless otherwise noted.  DIAGNOSTIC FINDINGS:  na  PATIENT SURVEYS:  Modified Oswestry 20/50 40%    SCREENING FOR RED FLAGS: Bowel or bladder incontinence: No Spinal tumors: No Cauda equina syndrome: No Compression fracture: No Abdominal aneurysm: No  COGNITION: Overall cognitive status: Within functional limits for tasks assessed     SENSATION: WFL  MUSCLE LENGTH: Hamstrings: Right nt deg; Left nt deg Thomas test: Right wfl deg; Left -10 deg  POSTURE: increased lordosis at TL junction, straightened upper thoracic spine, increased flexion B hips to 15 degrees Noted mild concavity R lumbar region, with elevated R iliac crest  PALPATION: Segmental testing with tender spinous processes T 4, T 6,7 to T 12/L1 Improved Sx with PA light overpressure over these sites  LUMBAR ROM: all WFL Thoracic spine: flex wfl, ext 20% Rotation 40% B  B shoulders flexion , abd -20 degrees of terminal ROM LOWER EXTREMITY ROM:   all wfl  LOWER EXTREMITY MMT:  wfl  UE MMT: all wnl   LUMBAR SPECIAL TESTS:  Thomas test: PositiveL   GAIT: Distance walked: wnl, gait in clinic, no device  TODAY'S TREATMENT:                                                                                                                              DATE:  12/09/23 NuStep L 5 x 6 min Seated Rows &  lats 20lb 2x10  Rows and Ext w/ Band to simulate at home blue 2x10 Hori abd green 2x10 S2S holding yellow ball 2x10 Shoulder ER yellow 2x10  12/02/23:  Attempted various pain relieving stretches, positioning to traction her thoracic spine, including seated thoracic extension over back of chair,  Supine over therapeutic ball for pecs stretch, thoracic extension stretch Supine over 1/2 6" foam roller, with roller placed parallel to spine and with roller placed perpendicular  to spine , alternated with gentle LTR's to engage TL jxn and hip flexors   PATIENT EDUCATION:  Education details: POC, goals Person educated: Patient Education method: Explanation, Demonstration, Tactile cues, and Verbal cues Education comprehension: verbalized understanding, returned demonstration, verbal cues required, tactile cues required, and needs further education  HOME EXERCISE PROGRAM: See above, the most effective exercise for the patient for relieving pain was the stretching over the 1/2 foam roller.   Access Code: [email or web address] URL: [email or web address] Date: 12/09/2023 Prepared by: Debroah Baller  Exercises - Standing Row with Resistance  - 1 x daily - 7 x weekly - 3 sets - 10 reps - Standing Shoulder Extension with Resistance  - 1 x daily - 7 x weekly - 3 sets - 10 reps - Sit to Stand  - 1 x daily - 7 x weekly - 3 sets - 10 reps - Seated Shoulder Horizontal Abduction with Resistance  - 1 x daily - 7 x weekly - 3 sets - 10 reps - Corner Pec Major Stretch  - 1 x daily -  7 x weekly - 3 sets - 10 reps  ASSESSMENT:  CLINICAL IMPRESSION: Patient is a 73 y.o. female who was evaluated today by skilled physical therapy for chronic thoracic spine pain.  Session consisted of HEP and other intervention she can do at her daughters at home gym. Cues to emphasize postural and squeezing shoulder blades together. All interventions completed well without an increase in pain. Pt was very pleasant during  session despite hight pain rating. Will benefit from this brief duration of physical therapy intervention to address her thoracic spine pain and teach her an effective home management program for pain , stretching, strengthening.  OBJECTIVE IMPAIRMENTS: decreased activity tolerance, decreased ROM, hypomobility, increased fascial restrictions, increased muscle spasms, impaired flexibility, impaired UE functional use, postural dysfunction, and pain.   ACTIVITY LIMITATIONS: carrying, lifting, standing, bathing, dressing, and reach over head  PARTICIPATION LIMITATIONS: meal prep, cleaning, laundry, community activity, and yard work  PERSONAL FACTORS: Age, Behavior pattern, Fitness, Past/current experiences, and 1-2 comorbidities: chronic c spine pain,   are also affecting patient's functional outcome.   REHAB POTENTIAL: Good  CLINICAL DECISION MAKING: Stable/uncomplicated  EVALUATION COMPLEXITY: Low   GOALS: Goals reviewed with patient? Yes   LONG TERM GOALS: Target date: 4 weeks, 12/30/23  I HEP, able to comprehend, progress with her strengthening for thoracic region. Baseline: initiated Goal status: INITIAL  2.  Improve thoracic spine extension to 75% from 40% Baseline:  Goal status: INITIAL  3.  Improve B shoulder flex, abd to 165 B Baseline: 140 Goal status: INITIAL   PLAN:  PT FREQUENCY: 1-2x/week  PT DURATION: 4 weeks  PLANNED INTERVENTIONS: 97110-Therapeutic exercises, 97530- Therapeutic activity, [id number]- Neuromuscular re-education, 97535- Self Care, [id number]- Manual therapy, and Spinal mobilization.  PLAN FOR NEXT SESSION: instruct in UE, periscapular strengthening with gym equipment to replicate her daughters home gym , TPDN was requested by referring MD , may attempt next visit if Otelia Santee available   Grayce Sessions, PTA,  12/09/2023, 3:17 PM

## 2023-12-11 ENCOUNTER — Ambulatory Visit: Payer: PPO

## 2023-12-11 ENCOUNTER — Other Ambulatory Visit: Payer: Self-pay

## 2023-12-11 DIAGNOSIS — M546 Pain in thoracic spine: Secondary | ICD-10-CM | POA: Diagnosis not present

## 2023-12-11 DIAGNOSIS — G8929 Other chronic pain: Secondary | ICD-10-CM

## 2023-12-11 DIAGNOSIS — M2560 Stiffness of unspecified joint, not elsewhere classified: Secondary | ICD-10-CM

## 2023-12-11 DIAGNOSIS — M25611 Stiffness of right shoulder, not elsewhere classified: Secondary | ICD-10-CM

## 2023-12-11 DIAGNOSIS — R6889 Other general symptoms and signs: Secondary | ICD-10-CM

## 2023-12-11 NOTE — Therapy (Unsigned)
OUTPATIENT PHYSICAL THERAPY THORACOLUMBAR    Patient Name: Mary Short MRN: [id number] DOB:01/06/1951, 72 y.o., female Today's Date: 12/12/2023  END OF SESSION:  PT End of Session - 12/11/23 1345     Visit Number 3    Date for PT Re-Evaluation 12/30/23    Authorization Type MCR    PT Start Time 1446    PT Stop Time 1530    PT Time Calculation (min) 44 min    Activity Tolerance Patient tolerated treatment well    Behavior During Therapy [facility] for tasks assessed/performed              Past Medical History:  Diagnosis Date   Hyperlipidemia    Osteoporosis    Seasonal allergies    Past Surgical History:  Procedure Laterality Date   BASAL CELL CARCINOMA EXCISION     hysterectomy     Dr. Lamonte Sakai   WISDOM TOOTH EXTRACTION  1974   Patient Active Problem List   Diagnosis Date Noted   Osteoporosis 10/06/2018   Hyperlipidemia 10/06/2018   Overactive bladder 10/06/2018   Seasonal allergies 10/06/2018    PCP: Abbey Chatters, NP  REFERRING PROVIDER: Monia Pouch, DO  REFERRING DIAG: chronic mid thoracic pain  Rationale for Evaluation and Treatment: Rehabilitation  THERAPY DIAG:  Chronic midline thoracic back pain  Decreased range of motion of right shoulder  Joint stiffness of spine  Decreased functional activity tolerance  ONSET DATE: 11/05/23  SUBJECTIVE:                                                                                                                                                                                           SUBJECTIVE STATEMENT: Did pretty good, ordered half foam rolls, but couldn't exactly remember how to perform  PERTINENT HISTORY:  Referred by neurologist, has chronic mid back pain radiating to R lateral chest wall with forward bending and reaching with R UE  PAIN:  Are you having pain? Yes: NPRS scale: 10/10 Pain location: between shoulder blades Pain description: starts with burning, then progresses to  cramping Aggravating factors: standing and slightly bending forward, such as with sewing, also using arms for tasks like chopping vegetables Relieving factors: lie on back with feet on bed or sofa  PRECAUTIONS: Other: osteoporosis  RED FLAGS: None   WEIGHT BEARING RESTRICTIONS: No  FALLS:  Has patient fallen in last 6 months? No  LIVING ENVIRONMENT: Lives with: lives with their family and lives with their spouse Lives in: House/apartment   OCCUPATION: retired  PLOF: Independent  PATIENT GOALS: be able to reduce the frequency and intensity of episodes of mid thoracic  pain   NEXT MD VISIT: over 3 months  OBJECTIVE:  Note: Objective measures were completed at Evaluation unless otherwise noted.  DIAGNOSTIC FINDINGS:  na  PATIENT SURVEYS:  Modified Oswestry 20/50 40%    SCREENING FOR RED FLAGS: Bowel or bladder incontinence: No Spinal tumors: No Cauda equina syndrome: No Compression fracture: No Abdominal aneurysm: No  COGNITION: Overall cognitive status: Within functional limits for tasks assessed     SENSATION: WFL  MUSCLE LENGTH: Hamstrings: Right nt deg; Left nt deg Thomas test: Right wfl deg; Left -10 deg  POSTURE: increased lordosis at TL junction, straightened upper thoracic spine, increased flexion B hips to 15 degrees Noted mild concavity R lumbar region, with elevated R iliac crest  PALPATION: Segmental testing with tender spinous processes T 4, T 6,7 to T 12/L1 Improved Sx with PA light overpressure over these sites  LUMBAR ROM: all WFL Thoracic spine: flex wfl, ext 20% Rotation 40% B  B shoulders flexion , abd -20 degrees of terminal ROM LOWER EXTREMITY ROM:   all wfl  LOWER EXTREMITY MMT:  wfl  UE MMT: all wnl   LUMBAR SPECIAL TESTS:  Thomas test: PositiveL   GAIT: Distance walked: wnl, gait in clinic, no device  TODAY'S TREATMENT:                                                                                                                               DATE: 12/11/23:therex:  Reviewed and pt practiced :Supine over 1/2 roll with roll ll to spine, with static stretch, arms at 90 degrees abd, cues to keep palms up, and to maintain LE's in hooklying.  Inst to utilize snow angel movement and B shoulder flexion  Also placed 1/2 foam roller horizontally , with T position, advised to move to different sites, and engage in isometric horizontal adduction, against the roller for isolated spine decompression Reviewed and practiced strengthening for periscapular region, post shoulders so that she may use her daughters' equipment when she travels there next week. Cable shoulder ext, rows, educated in changing hand position from vertical to horizontal to isolate middle traps, lats, lower traps, utilized 5 # plates each arm Modified lat pull down with 15# using cable and rope with 2 hand holds Also standing 3 way hip with 5#  with cable.  Pt with a lot of trunk sway so instructed in:   Theraband around thighs for stability training B hips, alt hip abd, and for/ back rocks to replicate 3 way hip.  Provided with written inst for this ex  12/09/23 NuStep L 5 x 6 min Seated Rows & lats 20lb 2x10  Rows and Ext w/ Band to simulate at home blue 2x10 Hori abd green 2x10 S2S holding yellow ball 2x10 Shoulder ER yellow 2x10  12/02/23:  Attempted various pain relieving stretches, positioning to traction her thoracic spine, including seated thoracic extension over back of chair,  Supine over therapeutic ball for pecs stretch, thoracic  extension stretch Supine over 1/2 6" foam roller, with roller placed parallel to spine and with roller placed perpendicular  to spine , alternated with gentle LTR's to engage TL jxn and hip flexors   PATIENT EDUCATION:  Education details: POC, goals Person educated: Patient Education method: Explanation, Demonstration, Tactile cues, and Verbal cues Education comprehension: verbalized understanding, returned  demonstration, verbal cues required, tactile cues required, and needs further education  HOME EXERCISE PROGRAM:Access Code: [email or web address] URL: [email or web address] Date: 12/11/2023 Prepared by: Sangeeta Youse  Exercises - Side Stepping with Resistance at Thighs  - 1 x daily - 7 x weekly - 3 sets - 10 reps - Forward Monster Walk with Resistance (BKA)  - 1 x daily - 7 x weekly - 3 sets - 10 reps - Lateral Monster Walk with Resistance (BKA)  - 1 x daily - 7 x weekly - 3 sets - 10 reps See above, the most effective exercise for the patient for relieving pain was the stretching over the 1/2 foam roller.   Access Code: [email or web address] URL: [email or web address] Date: 12/09/2023 Prepared by: Debroah Baller  Exercises - Standing Row with Resistance  - 1 x daily - 7 x weekly - 3 sets - 10 reps - Standing Shoulder Extension with Resistance  - 1 x daily - 7 x weekly - 3 sets - 10 reps - Sit to Stand  - 1 x daily - 7 x weekly - 3 sets - 10 reps - Seated Shoulder Horizontal Abduction with Resistance  - 1 x daily - 7 x weekly - 3 sets - 10 reps - Corner Pec Major Stretch  - 1 x daily - 7 x weekly - 3 sets - 10 reps  ASSESSMENT:  CLINICAL IMPRESSION: Patient is a 72 y.o. female who participate today in her 3rd skilled physical therapy session for chronic thoracic spine pain.  Session consisted of HEP and other intervention she can do at her daughters at home gym. At this point she indicates that she has good comprehension of her home ex program, and is ready to discontinue PT,  she will be leaving for [place] next week for 3 month stay at her daughter's home.    OBJECTIVE IMPAIRMENTS: decreased activity tolerance, decreased ROM, hypomobility, increased fascial restrictions, increased muscle spasms, impaired flexibility, impaired UE functional use, postural dysfunction, and pain.   ACTIVITY LIMITATIONS: carrying, lifting, standing, bathing, dressing, and reach over  head  PARTICIPATION LIMITATIONS: meal prep, cleaning, laundry, community activity, and yard work  PERSONAL FACTORS: Age, Behavior pattern, Fitness, Past/current experiences, and 1-2 comorbidities: chronic c spine pain,   are also affecting patient's functional outcome.   REHAB POTENTIAL: Good  CLINICAL DECISION MAKING: Stable/uncomplicated  EVALUATION COMPLEXITY: Low   GOALS: Goals reviewed with patient? Yes   LONG TERM GOALS: Target date: 4 weeks, 12/30/23  I HEP, able to comprehend, progress with her strengthening for thoracic region. Baseline: initiated Goal status: 12/12/23: MET  2.  Improve thoracic spine extension to 75% from 40% Baseline:  Goal status: INITIAL 12/11/23: NT today  3.  Improve B shoulder flex, abd to 165 B Baseline: 140 Goal status: 12/11/23: progressing   PLAN:  PT FREQUENCY: 1-2x/week  PT DURATION: 4 weeks  PLANNED INTERVENTIONS: 97110-Therapeutic exercises, 97530- Therapeutic activity, 97112- Neuromuscular re-education, 97535- Self Care, 74259- Manual therapy, and Spinal mobilization.  PLAN FOR NEXT SESSION: Dc at this time  Early Chars, PT, DPT, OCS 12/12/2023, 9:19 AM

## 2023-12-16 ENCOUNTER — Ambulatory Visit: Payer: PPO

## 2024-01-07 ENCOUNTER — Encounter: Payer: Self-pay | Admitting: Nurse Practitioner

## 2024-01-07 ENCOUNTER — Encounter: Payer: PPO | Admitting: Nurse Practitioner

## 2024-01-07 ENCOUNTER — Telehealth: Payer: Self-pay | Admitting: *Deleted

## 2024-01-07 NOTE — Progress Notes (Signed)
 This encounter was created in error - please disregard.

## 2024-01-07 NOTE — Telephone Encounter (Signed)
 Mary Short, Mary Short are scheduled for a virtual visit with your provider today.    Just as we do with appointments in the office, we must obtain your consent to participate.  Your consent will be active for this visit and any virtual visit you Mary Short have with one of our providers in the next 365 days.    If you have a MyChart account, I can also send a copy of this consent to you electronically.  All virtual visits are billed to your insurance company just like a traditional visit in the office.  As this is a virtual visit, video technology does not allow for your provider to perform a traditional examination.  This Mary Short limit your provider's ability to fully assess your condition.  If your provider identifies any concerns that need to be evaluated in person or the need to arrange testing such as labs, EKG, etc, we will make arrangements to do so.    Although advances in technology are sophisticated, we cannot ensure that it will always work on either your end or our end.  If the connection with a video visit is poor, we Mary Short have to switch to a telephone visit.  With either a video or telephone visit, we are not always able to ensure that we have a secure connection.   I need to obtain your verbal consent now.   Are you willing to proceed with your visit today?   Mary Short has provided verbal consent on 01/07/2024 for a virtual visit (video or telephone).   Mary Short, CMA 01/07/2024  10:38 AM

## 2024-01-07 NOTE — Progress Notes (Signed)
  This service is provided via telemedicine  No vital signs collected/recorded due to the encounter was a telemedicine visit.   Location of patient (ex: home, work):  Home  Patient consents to a telephone visit:  Yes  Location of the provider (ex: office, home):  Office [facility]  Name of any referring provider:  na  Names of all persons participating in the telemedicine service and their role in the encounter:  Mary Short, patient, [name], CMA, Harlene An, NP  Time spent on call:  7:11

## 2024-03-12 ENCOUNTER — Ambulatory Visit: Payer: PPO | Admitting: Nurse Practitioner

## 2024-03-12 DIAGNOSIS — E2839 Other primary ovarian failure: Secondary | ICD-10-CM

## 2024-03-12 DIAGNOSIS — Z0000 Encounter for general adult medical examination without abnormal findings: Secondary | ICD-10-CM

## 2024-03-12 NOTE — Patient Instructions (Signed)
  Ms. Baehr , Thank you for taking time to come for your Medicare Wellness Visit. I appreciate your ongoing commitment to your health goals. Please review the following plan we discussed and let me know if I can assist you in the future.    This is a list of the screening recommended for you and due dates:  Health Maintenance  Topic Date Due   Colon Cancer Screening  10/09/2023   COVID-19 Vaccine (9 - 2024-25 season) 02/22/2024   DTaP/Tdap/Td vaccine (2 - Td or Tdap) 10/29/2024   Medicare Annual Wellness Visit  03/12/2025   Mammogram  11/24/2025   Pneumonia Vaccine  Completed   Flu Shot  Completed   DEXA scan (bone density measurement)  Completed   Hepatitis C Screening  Completed   Zoster (Shingles) Vaccine  Completed   HPV Vaccine  Aged Out

## 2024-03-12 NOTE — Progress Notes (Signed)
 Subjective:   Mary Short is a 73 y.o. female who presents for Medicare Annual (Subsequent) preventive examination.  Visit Complete: Virtual I connected with  Mary Short on 03/12/24 by a video and audio enabled telemedicine application and verified that I am speaking with the correct person using two identifiers.  Patient Location: Home  Provider Location: Office/Clinic  I discussed the limitations of evaluation and management by telemedicine. The patient expressed understanding and agreed to proceed.  Vital Signs: Because this visit was a virtual/telehealth visit, some criteria may be missing or patient reported. Any vitals not documented were not able to be obtained and vitals that have been documented are patient reported.  Cardiac Risk Factors include: dyslipidemia;advanced age (>26men, >83 women)     Objective:    There were no vitals filed for this visit. There is no height or weight on file to calculate BMI.     03/12/2024   11:01 AM 01/07/2024   10:27 AM 12/02/2023    4:14 PM 09/09/2023   11:04 AM 03/15/2023   12:56 PM 01/02/2023   12:34 PM 06/11/2022   10:05 AM  Advanced Directives  Does Patient Have a Medical Advance Directive? Yes Yes No Yes Yes Yes No  Type of Estate agent of [place];Living will Healthcare Power of [place];Living will  Healthcare Power of [place];Living will Living will;Healthcare Power of Attorney    Does patient want to make changes to medical advance directive? No - Patient declined No - Patient declined  No - Patient declined No - Patient declined No - Guardian declined   Copy of Healthcare Power of Attorney in Chart? No - copy requested No - copy requested  No - copy requested No - copy requested    Would patient like information on creating a medical advance directive?   No - Patient declined    No - Patient declined    Current Medications (verified) Outpatient Encounter Medications as of 03/12/2024  Medication Sig    acetaminophen (TYLENOL) 500 MG tablet Take 1,000 mg by mouth as needed.   atorvastatin (LIPITOR) 20 MG tablet TAKE 1 TABLET BY MOUTH EVERY DAY   levocetirizine (XYZAL) 5 MG tablet Take 5 mg by mouth every evening.   Multiple Vitamin (MULTI-VITAMIN) tablet Take 1 tablet by mouth daily.   Omega-3 Fatty Acids (FISH OIL PO) Take by mouth daily.   pantoprazole (PROTONIX) 40 MG tablet TAKE 1 TABLET BY MOUTH EVERY DAY   tolterodine (DETROL) 2 MG tablet TAKE 1 TABLET BY MOUTH 2 TIMES DAILY.   traMADol (ULTRAM) 50 MG tablet Take 1 tablet (50 mg total) by mouth daily as needed for severe pain.   No facility-administered encounter medications on file as of 03/12/2024.    Allergies (verified) Fish allergy, Penicillins, and Tape   History: Past Medical History:  Diagnosis Date   Hyperlipidemia    Osteoporosis    Seasonal allergies    Past Surgical History:  Procedure Laterality Date   BASAL CELL CARCINOMA EXCISION     hysterectomy     Dr. Lamonte Sakai   WISDOM TOOTH EXTRACTION  1974   Family History  Problem Relation Age of Onset   Dementia Mother 41   Osteoporosis Mother    Heart failure Father        Deceased in Mar 09, 2021   Atrial fibrillation Father    Sick sinus syndrome Father    Heart disease Father    Stroke Paternal Aunt    Heart attack  Paternal Uncle    Breast cancer Maternal Grandmother 34   Diabetes Maternal Grandmother    Arthritis Maternal Grandfather    Dementia Maternal Grandfather    Cervical cancer Paternal Grandmother 1   BRCA 1/2 Neg Hx    Social History   Socioeconomic History   Marital status: Married    Spouse name: Not on file   Number of children: Not on file   Years of education: Not on file   Highest education level: Master's degree (e.g., MA, MS, MEng, MEd, MSW, MBA)  Occupational History   Not on file  Tobacco Use   Smoking status: Never   Smokeless tobacco: Never  Vaping Use   Vaping status: Never Used  Substance and Sexual Activity    Alcohol use: Yes    Comment: generally 1 drink per month   Drug use: Never   Sexual activity: Not Currently  Other Topics Concern   Not on file  Social History Narrative   Social History      Diet? Low carb/sugar      Do you drink/eat things with caffeine? Iced tea, chocolate      Marital status?   married          What year were you married? 1977      Do you live in a house, apartment, assisted living, condo, trailer, etc.? house      Is it one or more stories? 2      How many persons live in your home? 2      Do you have any pets in your home? (please list) 2 dogs      Highest level of education completed? Masters degree      Current or past profession: Geologist, engineering, Chief of Staff      Do you exercise?         Very little                             Type & how often? Walk, some weights      Advanced Directives      Do you have a living will? no      Do you have a DNR form?    no                              If not, do you want to discuss one? No, not now      Do you have signed POA/HPOA for forms? no      Functional Status      Do you have difficulty bathing or dressing yourself? no      Do you have difficulty preparing food or eating? no      Do you have difficulty managing your medications? no      Do you have difficulty managing your finances? no      Do you have difficulty affording your medications? no       Social Drivers of Corporate investment banker Strain: Low Risk  (03/09/2024)   Overall Financial Resource Strain (CARDIA)    Difficulty of Paying Living Expenses: Not hard at all  Food Insecurity: No Food Insecurity (03/12/2024)   Hunger Vital Sign    Worried About Running Out of Food in the Last Year: Never true    Ran Out of Food in the Last Year: Never true  Transportation Needs: No  Transportation Needs (03/12/2024)   PRAPARE - Administrator, Civil Service (Medical): No    Lack of Transportation (Non-Medical): No   Physical Activity: Insufficiently Active (03/09/2024)   Exercise Vital Sign    Days of Exercise per Week: 3 days    Minutes of Exercise per Session: 30 min  Stress: No Stress Concern Present (03/09/2024)   [employer] of Occupational Health - Occupational Stress Questionnaire    Feeling of Stress : Not at all  Social Connections: Moderately Isolated (03/09/2024)   Social Connection and Isolation Panel [NHANES]    Frequency of Communication with Friends and Family: More than three times a week    Frequency of Social Gatherings with Friends and Family: Once a week    Attends Religious Services: Never    Database administrator or Organizations: No    Attends Engineer, structural: Not on file    Marital Status: Married    Tobacco Counseling Counseling given: Not Answered   Clinical Intake:  Pre-visit preparation completed: Yes  Pain : No/denies pain     BMI - recorded: 29 Nutritional Risks: None  How often do you need to have someone help you when you read instructions, pamphlets, or other written materials from your doctor or pharmacy?: 1 - Never         Activities of Daily Living    03/12/2024   11:02 AM 03/09/2024    3:47 PM  In your present state of health, do you have any difficulty performing the following activities:  Hearing? 1 1  Vision? 0 0  Difficulty concentrating or making decisions? 0 1  Walking or climbing stairs? 0 1  Dressing or bathing? 0 0  Doing errands, shopping? 0 0  Preparing Food and eating ? N N  Using the Toilet? N N  In the past six months, have you accidently leaked urine? Y Y  Do you have problems with loss of bowel control? N N  Managing your Medications? N N  Managing your Finances? N N  Housekeeping or managing your Housekeeping? N Y    Patient Care Team: Sharon Seller, NP as PCP - General (Geriatric Medicine) Sydnee Cabal., MD as Referring Physician (Gastroenterology) Zelphia Cairo, MD as Consulting  Physician (Obstetrics and Gynecology) Alfredo Martinez, MD as Consulting Physician (Urology) Dorisann Frames, MD as Consulting Physician (Endocrinology)  Indicate any recent Medical Services you may have received from other than [facility] providers in the past year (date may be approximate).     Assessment:   This is a routine wellness examination for Casha.  Hearing/Vision screen Vision Screening - Comments:: Dr. Shelah Lewandowsky in [place] Last Visit: 2023   Goals Addressed               This Visit's Progress     Increase physical activity   On track     Would like to do more lifestyle modification with exercise- more weight bearing activity.       COMPLETED: Weight (lb) < 150 lb (68 kg) (pt-stated)        Would like to lose 15 lbs        Depression Screen    03/12/2024   11:07 AM 01/07/2024   10:27 AM 09/09/2023   10:58 AM 03/15/2023   12:56 PM 01/02/2023   12:34 PM 11/28/2021    1:15 PM 11/18/2019   10:48 AM  PHQ 2/9 Scores  PHQ - 2 Score 0 0 0 0 0 0  0    Fall Risk    03/12/2024   11:07 AM 03/09/2024    3:47 PM 01/07/2024   10:27 AM 01/06/2024    3:16 PM 09/09/2023   10:58 AM  Fall Risk   Falls in the past year? 1 1 1 1  0  Number falls in past yr: 1 1 0 0 0  Injury with Fall? 0 0 0 0 0  Risk for fall due to : History of fall(s)    No Fall Risks  Follow up Falls evaluation completed    Falls evaluation completed    MEDICARE RISK AT HOME: Medicare Risk at Home Any stairs in or around the home?: Yes If so, are there any without handrails?: No Home free of loose throw rugs in walkways, pet beds, electrical cords, etc?: No Adequate lighting in your home to reduce risk of falls?: Yes Life alert?: No Use of a cane, walker or w/c?: No Grab bars in the bathroom?: No Shower chair or bench in shower?: Yes Elevated toilet seat or a handicapped toilet?: No  TIMED UP AND GO:  Was the test performed?  No    Cognitive Function:    06/09/2021    9:15 AM 11/09/2019   10:41 AM  11/03/2018   10:54 AM  MMSE - Mini Mental State Exam  Orientation to time 5 5 5   Orientation to Place 5 5 5   Registration 3 3 3   Attention/ Calculation 5 5 5   Recall 3 3 3   Language- name 2 objects 2 2 2   Language- repeat 1 1 1   Language- follow 3 step command 3 3 3   Language- read & follow direction 1 1 1   Write a sentence 1 1 1   Copy design 1 1 1   Total score 30 30 30         03/12/2024   11:08 AM 01/07/2024   10:30 AM 11/28/2021    1:18 PM  6CIT Screen  What Year? 0 points 0 points 0 points  What month? 0 points 0 points 0 points  What time? 0 points 0 points 0 points  Count back from 20 0 points 0 points 0 points  Months in reverse 0 points 0 points 0 points  Repeat phrase 0 points 0 points 0 points  Total Score 0 points 0 points 0 points    Immunizations Immunization History  Administered Date(s) Administered   Fluad Quad(high Dose 65+) 10/05/2022   Influenza, High Dose Seasonal PF 10/06/2018, 10/01/2019, 08/22/2023   Influenza, Quadrivalent, Recombinant, Inj, Pf 09/08/2021   Influenza-Unspecified 09/30/2013, 10/29/2014, 12/17/2016, 08/31/2020   Moderna Covid-19 Fall Seasonal Vaccine 72yrs & older 08/22/2023   Moderna Covid-19 Vaccine Bivalent Booster 63yrs & up 10/05/2022   PFIZER Comirnaty(Gray Top)Covid-19 Tri-Sucrose Vaccine 07/25/2021   PFIZER(Purple Top)SARS-COV-2 Vaccination 02/07/2020, 03/04/2020, 09/30/2020   PNEUMOCOCCAL CONJUGATE-20 08/22/2023   Pfizer Covid-19 Vaccine Bivalent Booster 51yrs & up 04/09/2022   Pneumococcal Conjugate-13 11/03/2018   Pneumococcal Polysaccharide-23 11/09/2019   Pneumococcal-Unspecified 08/31/2020   Tdap 10/29/2014   Unspecified SARS-COV-2 Vaccination 07/25/2021   Zoster Recombinant(Shingrix) 10/10/2018, 12/09/2018   Zoster, Live 08/31/2020    TDAP status: Up to date  Flu Vaccine status: Up to date  Pneumococcal vaccine status: Up to date  Covid-19 vaccine status: Information provided on how to obtain vaccines.    Qualifies for Shingles Vaccine? Yes   Zostavax completed No   Shingrix Completed?: Yes  Screening Tests Health Maintenance  Topic Date Due   Colonoscopy  10/09/2023   COVID-19 Vaccine (  9 - 2024-25 season) 02/22/2024   DTaP/Tdap/Td (2 - Td or Tdap) 10/29/2024   Medicare Annual Wellness (AWV)  03/12/2025   MAMMOGRAM  11/24/2025   Pneumonia Vaccine 4+ Years old  Completed   INFLUENZA VACCINE  Completed   DEXA SCAN  Completed   Hepatitis C Screening  Completed   Zoster Vaccines- Shingrix  Completed   HPV VACCINES  Aged Out    Health Maintenance  Health Maintenance Due  Topic Date Due   Colonoscopy  10/09/2023   COVID-19 Vaccine (9 - 2024-25 season) 02/22/2024    Colorectal cancer screening: Type of screening: Colonoscopy. Completed 2024. Repeat every 5 years  Mammogram status: Completed 11/25/23. Repeat every year  Bone Density status: Completed 2021. Results reflect: Bone density results: OSTEOPENIA. Repeat every 2 years.  Lung Cancer Screening: (Low Dose CT Chest recommended if Age 60-80 years, 20 pack-year currently smoking OR have quit w/in 15years.) does not qualify.   Lung Cancer Screening Referral: na  Additional Screening:  Hepatitis C Screening: does qualify; Completed   Vision Screening: Recommended annual ophthalmology exams for early detection of glaucoma and other disorders of the eye. Is the patient up to date with their annual eye exam?  No  Who is the provider or what is the name of the office in which the patient attends annual eye exams?  Dr. Shelah Lewandowsky in [place] If pt is not established with a provider, would they like to be referred to a provider to establish care? Yes .   Dental Screening: Recommended annual dental exams for proper oral hygiene   Community Resource Referral / Chronic Care Management: CRR required this visit?  No   CCM required this visit?  No     Plan:     I have personally reviewed and noted the following in the  patient's chart:   Medical and social history Use of alcohol, tobacco or illicit drugs  Current medications and supplements including opioid prescriptions. Patient is not currently taking opioid prescriptions. Functional ability and status Nutritional status Physical activity Advanced directives List of other physicians Hospitalizations, surgeries, and ER visits in previous 12 months Vitals Screenings to include cognitive, depression, and falls Referrals and appointments  In addition, I have reviewed and discussed with patient certain preventive protocols, quality metrics, and best practice recommendations. A written personalized care plan for preventive services as well as general preventive health recommendations were provided to patient.     Sharon Seller, NP   03/12/2024   After Visit Summary: (MyChart) Due to this being a telephonic visit, the after visit summary with patients personalized plan was offered to patient via MyChart

## 2024-03-19 DIAGNOSIS — H2513 Age-related nuclear cataract, bilateral: Secondary | ICD-10-CM | POA: Diagnosis not present

## 2024-03-27 ENCOUNTER — Ambulatory Visit: Payer: PPO | Admitting: Nurse Practitioner

## 2024-03-30 DIAGNOSIS — M4722 Other spondylosis with radiculopathy, cervical region: Secondary | ICD-10-CM | POA: Diagnosis not present

## 2024-03-30 DIAGNOSIS — Z6829 Body mass index (BMI) 29.0-29.9, adult: Secondary | ICD-10-CM | POA: Diagnosis not present

## 2024-03-30 DIAGNOSIS — M546 Pain in thoracic spine: Secondary | ICD-10-CM | POA: Diagnosis not present

## 2024-04-01 DIAGNOSIS — M542 Cervicalgia: Secondary | ICD-10-CM | POA: Diagnosis not present

## 2024-04-01 DIAGNOSIS — M25511 Pain in right shoulder: Secondary | ICD-10-CM | POA: Diagnosis not present

## 2024-04-03 ENCOUNTER — Encounter: Payer: Self-pay | Admitting: Nurse Practitioner

## 2024-04-03 ENCOUNTER — Ambulatory Visit (INDEPENDENT_AMBULATORY_CARE_PROVIDER_SITE_OTHER): Admitting: Nurse Practitioner

## 2024-04-03 VITALS — BP 118/80 | HR 81 | Temp 98.4°F | Resp 16 | Ht 65.5 in | Wt 182.4 lb

## 2024-04-03 DIAGNOSIS — E042 Nontoxic multinodular goiter: Secondary | ICD-10-CM

## 2024-04-03 DIAGNOSIS — R946 Abnormal results of thyroid function studies: Secondary | ICD-10-CM | POA: Diagnosis not present

## 2024-04-03 DIAGNOSIS — N3281 Overactive bladder: Secondary | ICD-10-CM

## 2024-04-03 DIAGNOSIS — M810 Age-related osteoporosis without current pathological fracture: Secondary | ICD-10-CM

## 2024-04-03 DIAGNOSIS — K219 Gastro-esophageal reflux disease without esophagitis: Secondary | ICD-10-CM | POA: Diagnosis not present

## 2024-04-03 DIAGNOSIS — J302 Other seasonal allergic rhinitis: Secondary | ICD-10-CM | POA: Diagnosis not present

## 2024-04-03 DIAGNOSIS — E782 Mixed hyperlipidemia: Secondary | ICD-10-CM | POA: Diagnosis not present

## 2024-04-03 DIAGNOSIS — E663 Overweight: Secondary | ICD-10-CM

## 2024-04-03 DIAGNOSIS — M542 Cervicalgia: Secondary | ICD-10-CM

## 2024-04-03 DIAGNOSIS — B351 Tinea unguium: Secondary | ICD-10-CM | POA: Diagnosis not present

## 2024-04-03 MED ORDER — ESOMEPRAZOLE MAGNESIUM 40 MG PO CPDR: 40.0000 mg | DELAYED_RELEASE_CAPSULE | Freq: Every day | ORAL | 3 refills | Status: DC

## 2024-04-03 MED ORDER — TOLNAFTATE 1 % EX SOLN: 1.0000 | Freq: Two times a day (BID) | CUTANEOUS | 3 refills | Status: AC

## 2024-04-03 NOTE — Progress Notes (Signed)
 Careteam: Patient Care Team: Sharon Seller, NP as PCP - General (Geriatric Medicine) Sydnee Cabal., MD as Referring Physician (Gastroenterology) Zelphia Cairo, MD as Consulting Physician (Obstetrics and Gynecology) Alfredo Martinez, MD as Consulting Physician (Urology) Dorisann Frames, MD as Consulting Physician (Endocrinology)  PLACE OF SERVICE:  [facility]  Advanced Directive information    Allergies  Allergen Reactions   Fish Allergy     Any fish in the flounder family causes rash, difficulty breathing, swelling of eyes, itching, water/swelling under eyes   Penicillins     Swelling of the lips and a rash   Tape     Localized rash    Chief Complaint  Patient presents with   Medical Management of Chronic Issues    6 month follow up. Discuss the need for Covid Booster, and Colonoscopy.    Discussed the use of AI scribe software for clinical note transcription with the patient, who gave verbal consent to proceed.  History of Present Illness   Mary Short "Su" is a 73 year old female who presents for a six-month follow-up with concerns about weight gain, acid reflux, and toenail fungus.  She is experiencing persistent weight gain despite efforts to manage it, accompanied by lethargy 90% of the time, impacting daily activities. She is concerned about her weight and energy levels.  She continues to have issues with acid reflux, describing a sensation of gagging when swallowing and a feeling of something being stuck in her throat. She is currently taking Protonix for acid reflux and uses allergy medication and a nasal spray. Dairy seems to exacerbate her symptoms, and she often feels 'acidy'. She has a history of an ulcer but does not currently experience pain. Occasionally, she chokes while swallowing, which is concerning given her family history of swallowing difficulties.  She is taking Advil for neck and shoulder pain following a fall, which has been  exacerbating her indigestion. She has a pre-existing condition related to her cervical spine and is scheduled for an MRI in three weeks.  She reports a long-standing issue with toenail fungus, which has not responded to over-the-counter treatments. She has a history of exposure to conditions conducive to fungal infections[place]      Review of Systems:  Review of Systems  Constitutional:  Negative for chills, fever and weight loss.  HENT:  Negative for tinnitus.   Respiratory:  Negative for cough, sputum production and shortness of breath.   Cardiovascular:  Negative for chest pain, palpitations and leg swelling.  Gastrointestinal:  Positive for heartburn. Negative for abdominal pain, constipation and diarrhea.  Genitourinary:  Negative for dysuria, frequency and urgency.  Musculoskeletal:  Positive for neck pain. Negative for back pain, falls, joint pain and myalgias.  Skin: Negative.   Neurological:  Negative for dizziness and headaches.  Psychiatric/Behavioral:  Negative for depression and memory loss. The patient does not have insomnia.     Past Medical History:  Diagnosis Date   Hyperlipidemia    Osteoporosis    Seasonal allergies    Past Surgical History:  Procedure Laterality Date   BASAL CELL CARCINOMA EXCISION     hysterectomy     Dr. Lamonte Sakai   WISDOM TOOTH EXTRACTION  1974   Social History:   reports that she has never smoked. She has never used smokeless tobacco. She reports current alcohol use. She reports that she does not use drugs.  Family History  Problem Relation Age of  Onset   Dementia Mother 39   Osteoporosis Mother    Heart failure Father        Deceased in Feb 25, 2021   Atrial fibrillation Father    Sick sinus syndrome Father    Heart disease Father    Stroke Paternal Aunt    Heart attack Paternal Uncle    Breast cancer Maternal Grandmother 60   Diabetes Maternal Grandmother    Arthritis Maternal Grandfather     Dementia Maternal Grandfather    Cervical cancer Paternal Grandmother 19   BRCA 1/2 Neg Hx     Medications: Patient's Medications  New Prescriptions   No medications on file  Previous Medications   ACETAMINOPHEN (TYLENOL) 500 MG TABLET    Take 1,000 mg by mouth as needed.   ATORVASTATIN (LIPITOR) 20 MG TABLET    TAKE 1 TABLET BY MOUTH EVERY DAY   CALCIUM PO    Take 1 Piece by mouth daily at 12 noon.   LEVOCETIRIZINE (XYZAL) 5 MG TABLET    Take 5 mg by mouth every evening.   MAGNESIUM GLUCONATE PO    Take 1 capsule by mouth daily at 12 noon.   MULTIPLE VITAMIN (MULTI-VITAMIN) TABLET    Take 1 tablet by mouth daily.   OMEGA-3 FATTY ACIDS (FISH OIL PO)    Take by mouth daily.   PANTOPRAZOLE (PROTONIX) 40 MG TABLET    TAKE 1 TABLET BY MOUTH EVERY DAY   TOLTERODINE (DETROL) 2 MG TABLET    TAKE 1 TABLET BY MOUTH 2 TIMES DAILY.   TRAMADOL (ULTRAM) 50 MG TABLET    Take 1 tablet (50 mg total) by mouth daily as needed for severe pain.  Modified Medications   No medications on file  Discontinued Medications   No medications on file    Physical Exam:  Vitals:   04/03/24 1439  BP: 118/80  Pulse: 81  Resp: 16  Temp: 98.4 F (36.9 C)  SpO2: 93%  Weight: 182 lb 6.4 oz (82.7 kg)  Height: 5' 5.5" (1.664 m)   Body mass index is 29.89 kg/m. Wt Readings from Last 3 Encounters:  04/03/24 182 lb 6.4 oz (82.7 kg)  09/09/23 174 lb (78.9 kg)  03/15/23 180 lb 6.4 oz (81.8 kg)    Physical Exam Constitutional:      General: She is not in acute distress.    Appearance: She is well-developed. She is not diaphoretic.  HENT:     Head: Normocephalic and atraumatic.     Mouth/Throat:     Pharynx: No oropharyngeal exudate.  Eyes:     Conjunctiva/sclera: Conjunctivae normal.     Pupils: Pupils are equal, round, and reactive to light.  Cardiovascular:     Rate and Rhythm: Normal rate and regular rhythm.     Heart sounds: Normal heart sounds.  Pulmonary:     Effort: Pulmonary effort is normal.      Breath sounds: Normal breath sounds.  Abdominal:     General: Bowel sounds are normal.     Palpations: Abdomen is soft.  Musculoskeletal:     Cervical back: Normal range of motion and neck supple.     Right lower leg: No edema.     Left lower leg: No edema.  Skin:    General: Skin is warm and dry.  Neurological:     Mental Status: She is alert.  Psychiatric:        Mood and Affect: Mood normal.     Labs reviewed:  Basic Metabolic Panel: Recent Labs    09/09/23 1155  NA 141  K 4.3  CL 103  CO2 30  GLUCOSE 92  BUN 19  CREATININE 0.94  CALCIUM 9.8   Liver Function Tests: Recent Labs    09/09/23 1155  AST 20  ALT 20  BILITOT 0.6  PROT 6.7   No results for input(s): "LIPASE", "AMYLASE" in the last 8760 hours. No results for input(s): "AMMONIA" in the last 8760 hours. CBC: Recent Labs    09/09/23 1155  WBC 3.8  NEUTROABS 2,261  HGB 14.2  HCT 42.0  MCV 92.9  PLT 208   Lipid Panel: Recent Labs    09/09/23 1155  CHOL 171  HDL 81  LDLCALC 74  TRIG 84  CHOLHDL 2.1   TSH: No results for input(s): "TSH" in the last 8760 hours. A1C: Lab Results  Component Value Date   HGBA1C 5.2 05/13/2018     Assessment/Plan Assessment and Plan    BMI 29  Weight is up 2 lbs from last year. Reports ongoing fatigue despite management efforts. BMI 29.89. Insufficient protein and physical inactivity may contribute. Thyroid function to be checked due to nodules. - Check thyroid function. - Encourage increased protein intake, 30 grams per meal. - Advise reducing peanut butter intake and sweets  -limit starch and consume complex carbohydrates in moderation.  - Recommend 30 minutes of walking, five days a week.  Gastroesophageal reflux disease (GERD) Persistent reflux symptoms despite Protonix. Dietary factors and NSAID use may exacerbate. Plan to switch to Nexium and consider gastroenterologist referral if no improvement. - Discontinue Protonix. - Start Nexium  prescription. - Advise dietary changes to avoid exacerbating foods. - Avoid NSAIDs such as Advil, Aleve, Motrin, and ibuprofen. - Refer to gastroenterologist if no improvement after one month on Nexium.  Toenail fungus Unresponsive to over-the-counter treatments. Discussed tolnaftate 1% solution for treatment. - Prescribe tolnaftate 1% solution, apply twice daily until resolution and continue for one week after resolution.  Cervicalgia Pending MRI, followed by ortho and neurosurgery   Age-related osteoporosis without current pathological fracture -Recommended to take calcium 600 mg twice daily with Vitamin D 2000 units daily and weight bearing activity 30 mins/5 days a week - COMPLETE METABOLIC PANEL WITHOUT GFR  Overactive bladder Maintained on detrol   Mixed hyperlipidemia Continues on liptor  Seasonal allergies Stable continues on xyzal  Multiple thyroid nodules Follow up TSH, seeing endocrine every 2 years    Return in about 6 months (around 10/03/2024) for routine follow up, labs at time of visit. Janene Harvey. [facility] [phone]

## 2024-04-03 NOTE — Patient Instructions (Addendum)
 Avoid NSAIDS (Aleve, Advil, Motrin, Ibuprofen)   To paint on tolnaftate twice daily until resolved and then use for 1 week once resolved- 4-6 weeks total.

## 2024-04-04 LAB — COMPLETE METABOLIC PANEL WITHOUT GFR
AG Ratio: 1.9 (calc) (ref 1.0–2.5)
ALT: 19 U/L (ref 6–29)
AST: 19 U/L (ref 10–35)
Albumin: 4.6 g/dL (ref 3.6–5.1)
Alkaline phosphatase (APISO): 53 U/L (ref 37–153)
BUN: 23 mg/dL (ref 7–25)
CO2: 29 mmol/L (ref 20–32)
Calcium: 9.5 mg/dL (ref 8.6–10.4)
Chloride: 104 mmol/L (ref 98–110)
Creat: 0.79 mg/dL (ref 0.60–1.00)
Globulin: 2.4 g/dL (ref 1.9–3.7)
Glucose, Bld: 88 mg/dL (ref 65–139)
Potassium: 4.5 mmol/L (ref 3.5–5.3)
Sodium: 140 mmol/L (ref 135–146)
Total Bilirubin: 0.6 mg/dL (ref 0.2–1.2)
Total Protein: 7 g/dL (ref 6.1–8.1)

## 2024-04-04 LAB — CBC WITH DIFFERENTIAL/PLATELET
Absolute Lymphocytes: 1260 {cells}/uL (ref 850–3900)
Absolute Monocytes: 304 {cells}/uL (ref 200–950)
Basophils Absolute: 20 {cells}/uL (ref 0–200)
Basophils Relative: 0.5 %
Eosinophils Absolute: 112 {cells}/uL (ref 15–500)
Eosinophils Relative: 2.8 %
HCT: 42.2 % (ref 35.0–45.0)
Hemoglobin: 14.3 g/dL (ref 11.7–15.5)
MCH: 30.9 pg (ref 27.0–33.0)
MCHC: 33.9 g/dL (ref 32.0–36.0)
MCV: 91.1 fL (ref 80.0–100.0)
MPV: 9.3 fL (ref 7.5–12.5)
Monocytes Relative: 7.6 %
Neutro Abs: 2304 {cells}/uL (ref 1500–7800)
Neutrophils Relative %: 57.6 %
Platelets: 207 10*3/uL (ref 140–400)
RBC: 4.63 10*6/uL (ref 3.80–5.10)
RDW: 12.4 % (ref 11.0–15.0)
Total Lymphocyte: 31.5 %
WBC: 4 10*3/uL (ref 3.8–10.8)

## 2024-04-04 LAB — TSH: TSH: 1.71 m[IU]/L (ref 0.40–4.50)

## 2024-04-06 ENCOUNTER — Encounter: Payer: Self-pay | Admitting: Nurse Practitioner

## 2024-04-22 DIAGNOSIS — R202 Paresthesia of skin: Secondary | ICD-10-CM | POA: Diagnosis not present

## 2024-04-22 DIAGNOSIS — M546 Pain in thoracic spine: Secondary | ICD-10-CM | POA: Diagnosis not present

## 2024-04-22 DIAGNOSIS — M542 Cervicalgia: Secondary | ICD-10-CM | POA: Diagnosis not present

## 2024-04-22 DIAGNOSIS — M4802 Spinal stenosis, cervical region: Secondary | ICD-10-CM | POA: Diagnosis not present

## 2024-04-22 DIAGNOSIS — M4804 Spinal stenosis, thoracic region: Secondary | ICD-10-CM | POA: Diagnosis not present

## 2024-04-22 DIAGNOSIS — M5184 Other intervertebral disc disorders, thoracic region: Secondary | ICD-10-CM | POA: Diagnosis not present

## 2024-04-22 DIAGNOSIS — M47814 Spondylosis without myelopathy or radiculopathy, thoracic region: Secondary | ICD-10-CM | POA: Diagnosis not present

## 2024-04-22 DIAGNOSIS — M4722 Other spondylosis with radiculopathy, cervical region: Secondary | ICD-10-CM | POA: Diagnosis not present

## 2024-04-22 DIAGNOSIS — M79601 Pain in right arm: Secondary | ICD-10-CM | POA: Diagnosis not present

## 2024-04-23 DIAGNOSIS — L820 Inflamed seborrheic keratosis: Secondary | ICD-10-CM | POA: Diagnosis not present

## 2024-04-23 DIAGNOSIS — D225 Melanocytic nevi of trunk: Secondary | ICD-10-CM | POA: Diagnosis not present

## 2024-04-23 DIAGNOSIS — L2989 Other pruritus: Secondary | ICD-10-CM | POA: Diagnosis not present

## 2024-04-23 DIAGNOSIS — L570 Actinic keratosis: Secondary | ICD-10-CM | POA: Diagnosis not present

## 2024-04-23 DIAGNOSIS — L814 Other melanin hyperpigmentation: Secondary | ICD-10-CM | POA: Diagnosis not present

## 2024-04-23 DIAGNOSIS — L578 Other skin changes due to chronic exposure to nonionizing radiation: Secondary | ICD-10-CM | POA: Diagnosis not present

## 2024-04-23 DIAGNOSIS — Z85828 Personal history of other malignant neoplasm of skin: Secondary | ICD-10-CM | POA: Diagnosis not present

## 2024-04-23 DIAGNOSIS — L821 Other seborrheic keratosis: Secondary | ICD-10-CM | POA: Diagnosis not present

## 2024-05-11 DIAGNOSIS — M546 Pain in thoracic spine: Secondary | ICD-10-CM | POA: Diagnosis not present

## 2024-05-11 DIAGNOSIS — M4722 Other spondylosis with radiculopathy, cervical region: Secondary | ICD-10-CM | POA: Diagnosis not present

## 2024-05-20 DIAGNOSIS — M24571 Contracture, right ankle: Secondary | ICD-10-CM | POA: Diagnosis not present

## 2024-05-20 DIAGNOSIS — M205X1 Other deformities of toe(s) (acquired), right foot: Secondary | ICD-10-CM | POA: Diagnosis not present

## 2024-05-20 DIAGNOSIS — M7661 Achilles tendinitis, right leg: Secondary | ICD-10-CM | POA: Diagnosis not present

## 2024-05-20 DIAGNOSIS — M24572 Contracture, left ankle: Secondary | ICD-10-CM | POA: Diagnosis not present

## 2024-05-20 DIAGNOSIS — R262 Difficulty in walking, not elsewhere classified: Secondary | ICD-10-CM | POA: Diagnosis not present

## 2024-05-20 DIAGNOSIS — M205X2 Other deformities of toe(s) (acquired), left foot: Secondary | ICD-10-CM | POA: Diagnosis not present

## 2024-05-20 DIAGNOSIS — M722 Plantar fascial fibromatosis: Secondary | ICD-10-CM | POA: Diagnosis not present

## 2024-05-26 DIAGNOSIS — M5412 Radiculopathy, cervical region: Secondary | ICD-10-CM | POA: Diagnosis not present

## 2024-05-30 ENCOUNTER — Other Ambulatory Visit: Payer: Self-pay | Admitting: Nurse Practitioner

## 2024-05-30 DIAGNOSIS — E782 Mixed hyperlipidemia: Secondary | ICD-10-CM

## 2024-06-03 DIAGNOSIS — M7662 Achilles tendinitis, left leg: Secondary | ICD-10-CM | POA: Diagnosis not present

## 2024-06-03 DIAGNOSIS — R262 Difficulty in walking, not elsewhere classified: Secondary | ICD-10-CM | POA: Diagnosis not present

## 2024-06-03 DIAGNOSIS — M205X1 Other deformities of toe(s) (acquired), right foot: Secondary | ICD-10-CM | POA: Diagnosis not present

## 2024-06-03 DIAGNOSIS — M205X2 Other deformities of toe(s) (acquired), left foot: Secondary | ICD-10-CM | POA: Diagnosis not present

## 2024-06-03 DIAGNOSIS — M7661 Achilles tendinitis, right leg: Secondary | ICD-10-CM | POA: Diagnosis not present

## 2024-06-03 DIAGNOSIS — M24572 Contracture, left ankle: Secondary | ICD-10-CM | POA: Diagnosis not present

## 2024-06-03 DIAGNOSIS — M722 Plantar fascial fibromatosis: Secondary | ICD-10-CM | POA: Diagnosis not present

## 2024-06-03 DIAGNOSIS — M24571 Contracture, right ankle: Secondary | ICD-10-CM | POA: Diagnosis not present

## 2024-06-19 DIAGNOSIS — Z6828 Body mass index (BMI) 28.0-28.9, adult: Secondary | ICD-10-CM | POA: Diagnosis not present

## 2024-06-19 DIAGNOSIS — M5412 Radiculopathy, cervical region: Secondary | ICD-10-CM | POA: Diagnosis not present

## 2024-06-19 DIAGNOSIS — M549 Dorsalgia, unspecified: Secondary | ICD-10-CM | POA: Diagnosis not present

## 2024-06-29 DIAGNOSIS — E039 Hypothyroidism, unspecified: Secondary | ICD-10-CM | POA: Diagnosis not present

## 2024-06-29 DIAGNOSIS — E049 Nontoxic goiter, unspecified: Secondary | ICD-10-CM | POA: Diagnosis not present

## 2024-06-30 DIAGNOSIS — M205X1 Other deformities of toe(s) (acquired), right foot: Secondary | ICD-10-CM | POA: Diagnosis not present

## 2024-06-30 DIAGNOSIS — M205X2 Other deformities of toe(s) (acquired), left foot: Secondary | ICD-10-CM | POA: Diagnosis not present

## 2024-06-30 DIAGNOSIS — M24572 Contracture, left ankle: Secondary | ICD-10-CM | POA: Diagnosis not present

## 2024-06-30 DIAGNOSIS — R262 Difficulty in walking, not elsewhere classified: Secondary | ICD-10-CM | POA: Diagnosis not present

## 2024-06-30 DIAGNOSIS — M7661 Achilles tendinitis, right leg: Secondary | ICD-10-CM | POA: Diagnosis not present

## 2024-06-30 DIAGNOSIS — M722 Plantar fascial fibromatosis: Secondary | ICD-10-CM | POA: Diagnosis not present

## 2024-06-30 DIAGNOSIS — M7662 Achilles tendinitis, left leg: Secondary | ICD-10-CM | POA: Diagnosis not present

## 2024-06-30 DIAGNOSIS — M24571 Contracture, right ankle: Secondary | ICD-10-CM | POA: Diagnosis not present

## 2024-07-20 NOTE — Therapy (Signed)
 OUTPATIENT PHYSICAL THERAPY BACK EVALUATION   Patient Name: Mary Short MRN: [id number] DOB:January 11, 1951, 73 y.o., female Today's Date: 07/21/2024  END OF SESSION:  PT End of Session - 07/21/24 1313     Visit Number 1    Authorization Type HTA    PT Start Time 1315    PT Stop Time 1355    PT Time Calculation (min) 40 min          Past Medical History:  Diagnosis Date   Hyperlipidemia    Osteoporosis    Seasonal allergies    Past Surgical History:  Procedure Laterality Date   BASAL CELL CARCINOMA EXCISION     hysterectomy     Dr. MARLA Elspeth Bellman   WISDOM TOOTH EXTRACTION  1974   Patient Active Problem List   Diagnosis Date Noted   Osteoporosis 10/06/2018   Hyperlipidemia 10/06/2018   Overactive bladder 10/06/2018   Seasonal allergies 10/06/2018    PCP: Harlene An  REFERRING PROVIDER: Dorn Ned   REFERRING DIAG:  M54.9 (ICD-10-CM) - Dorsalgia, unspecified     THERAPY DIAG:  Chronic midline thoracic back pain  Joint stiffness of spine  Decreased functional activity tolerance  Muscle spasm of back  Rationale for Evaluation and Treatment: Rehabilitation  ONSET DATE: 06/23/24 referral date  SUBJECTIVE:                                                                                                                                                                                                         SUBJECTIVE STATEMENT: It is the back. I finally had an injection in my neck, it helped but it is starting to come back a little bit. The biggest issue that is continuing for years, is any repetitive action with my neck and back flexed causes pain through the mid back.    PERTINENT HISTORY:  Cervical epidural injection  Osteoporosis   PAIN:  Are you having pain? Yes: NPRS scale: 8/10 when it does hurt Pain location: mid back, radiates around ribs on R side Pain description: spasms, sharp Aggravating factors: anything where my head is down and  I have to bend at the waist,  Relieving factors: hot shower, heat, pressure on the spine, laying flat  PRECAUTIONS: None  RED FLAGS: None     WEIGHT BEARING RESTRICTIONS: No  FALLS:  Has patient fallen in last 6 months? No  LIVING ENVIRONMENT: Lives with: lives with their family and lives with their spouse Lives in: House/apartment    OCCUPATION: Retired  PLOF: Independent  PATIENT GOALS: a cure that  is long lasting so I can continue to do activities   NEXT MD VISIT: will call after a reasonable amount of PT appts   OBJECTIVE:  Note: Objective measures were completed at Evaluation unless otherwise noted.  DIAGNOSTIC FINDINGS:  C4-C7 moderate stenosis and moderate to severe foraminal stenosis as well as loss of lordosis.   MRI of thoracic spine shows multilevel degenerative disease without significant stenosis or instability.   COGNITION: Overall cognitive status: Within functional limits for tasks assessed  SENSATION: WFL  PALPATION: TTP T8-T10, tightness in upper traps    CERVICAL ROM:   Active ROM A/PROM (deg) eval  Flexion WNL  Extension WNL  Right lateral flexion 25%  Left lateral flexion 25%  Right rotation 75%  Left rotation 60%   (Blank rows = not tested)  UPPER EXTREMITY ROM: WNL  UPPER EXTREMITY MMT: flexion and abd 3+/5 both sides, ER/IR 5/5    LUMBAR ROM:   Active ROM A/PROM (deg) eval  Flexion WFL  Extension WFL  Right lateral flexion Equal on both sides  Left lateral flexion Equal on both sides  Right rotation WFL  Left rotation WFL   (Blank rows = not tested)  LOWER EXTREMITY ROM: WFL   LOWER EXTREMITY MMT: 5/5 BLE   TREATMENT DATE:  07/21/24 EVAL                                                                                                                                 PATIENT EDUCATION:  Education details: POC, HEP, posture education and how strengthening can help Person educated: Patient Education method:  Explanation and Demonstration Education comprehension: verbalized understanding and returned demonstration  HOME EXERCISE PROGRAM: Access Code: [email or web address] URL: [email or web address] Date: 07/21/2024 Prepared by: Almetta Fam  Exercises - Cat Cow  - 1 x daily - 7 x weekly - 2 sets - 10 reps - Thoracic Extension Mobilization on Foam Roll  - 1 x daily - 7 x weekly - 2 sets - 10 reps - Standing Shoulder Row with Anchored Resistance  - 1 x daily - 7 x weekly - 2 sets - 10 reps - Shoulder extension with resistance - Neutral  - 1 x daily - 7 x weekly - 2 sets - 10 reps - Doorway Pec Stretch at 90 Degrees Abduction  - 1 x daily - 7 x weekly - 2 reps - 15 hold - Seated Cervical Sidebending Stretch  - 1 x daily - 7 x weekly - 2 reps - 15 hold  ASSESSMENT:  CLINICAL IMPRESSION: Patient is a 73 y.o. female who was seen today for physical therapy evaluation and treatment for dorsalgia. She reports ongoing mid back pain for years. The pain is sharp and feels like spasms when it does occur. She reports after doing an activity that requires any slight flexion is difficulty-- this includes sowing, cooking, cleaning, etc. She has about a 10 min tolerance. Patient is limited with neck ROM,  especially with lateral flexion. She also has some weakness in bilateral shoulder flexion and abduction. She reports some relief with manual pressure on the spine and if laying flat; heat also seems to help temporarily. Patient will benefit from PT to strengthening her postural back muscles to improve her activity tolerance and address her back pain for a longer lasting solution to her issues.  OBJECTIVE IMPAIRMENTS: decreased activity tolerance, decreased ROM, decreased strength, hypomobility, increased muscle spasms, improper body mechanics, and pain.    ACTIVITY LIMITATIONS: carrying, lifting, bending, and standing  PARTICIPATION LIMITATIONS: meal prep, cleaning, laundry, community activity, and yard  work  PERSONAL FACTORS: Age and Time since onset of injury/illness/exacerbation are also affecting patient's functional outcome.   REHAB POTENTIAL: Good  CLINICAL DECISION MAKING: Stable/uncomplicated  EVALUATION COMPLEXITY: Low  GOALS: Goals reviewed with patient? Yes  SHORT TERM GOALS: Target date: 09/01/24  Patient will be independent with initial HEP.  Baseline:  Goal status: INITIAL   LONG TERM GOALS: Target date: 10/13/24  Patient will be independent with advanced/ongoing HEP to improve outcomes and carryover.  Baseline:  Goal status: INITIAL  2.  Patient will report 75% improvement in back pain to improve QOL.  Baseline: 8/10 at worst Goal status: INITIAL  3.  Patient will demonstrate full pain free cervical ROM for safety with driving.  Baseline: limited side bending and rotation Goal status: INITIAL  4.  Patient will increase cooking, sowing, and standing activity tolerance to 30 mins  Baseline: 10 mins Goal status: INITIAL  5.  Patient will improve shoulder flexion and abduction strength to 4+/5 or better   Baseline: 3+ Goal status: INITIAL   PLAN:  PT FREQUENCY: 1-2x/week  PT DURATION: 12 weeks  PLANNED INTERVENTIONS: 97110-Therapeutic exercises, 97530- Therapeutic activity, 97112- Neuromuscular re-education, 97535- Self Care, 02859- Manual therapy, G0283- Electrical stimulation (unattended), [phone]- Traction (mechanical), 20560 (1-2 muscles), 20561 (3+ muscles)- Dry Needling, Patient/Family education, Taping, Joint mobilization, Joint manipulation, Spinal manipulation, Spinal mobilization, Cryotherapy, and Moist heat  PLAN FOR NEXT SESSION: thoracic joint mobilizations, postural strengthening, STM to upper traps and along paraspinal into thoracic    Almetta Fam, PT 07/21/2024, 1:58 PM

## 2024-07-21 ENCOUNTER — Ambulatory Visit: Attending: Neurosurgery

## 2024-07-21 DIAGNOSIS — R6889 Other general symptoms and signs: Secondary | ICD-10-CM | POA: Diagnosis not present

## 2024-07-21 DIAGNOSIS — M6281 Muscle weakness (generalized): Secondary | ICD-10-CM | POA: Diagnosis not present

## 2024-07-21 DIAGNOSIS — M62830 Muscle spasm of back: Secondary | ICD-10-CM | POA: Diagnosis not present

## 2024-07-21 DIAGNOSIS — M2560 Stiffness of unspecified joint, not elsewhere classified: Secondary | ICD-10-CM | POA: Diagnosis not present

## 2024-07-21 DIAGNOSIS — M546 Pain in thoracic spine: Secondary | ICD-10-CM | POA: Diagnosis not present

## 2024-07-21 DIAGNOSIS — G8929 Other chronic pain: Secondary | ICD-10-CM | POA: Insufficient documentation

## 2024-07-25 ENCOUNTER — Other Ambulatory Visit: Payer: Self-pay | Admitting: Nurse Practitioner

## 2024-07-25 DIAGNOSIS — K219 Gastro-esophageal reflux disease without esophagitis: Secondary | ICD-10-CM

## 2024-07-28 DIAGNOSIS — M7661 Achilles tendinitis, right leg: Secondary | ICD-10-CM | POA: Diagnosis not present

## 2024-07-28 DIAGNOSIS — M24571 Contracture, right ankle: Secondary | ICD-10-CM | POA: Diagnosis not present

## 2024-07-28 DIAGNOSIS — M722 Plantar fascial fibromatosis: Secondary | ICD-10-CM | POA: Diagnosis not present

## 2024-07-28 DIAGNOSIS — R262 Difficulty in walking, not elsewhere classified: Secondary | ICD-10-CM | POA: Diagnosis not present

## 2024-07-28 DIAGNOSIS — M205X1 Other deformities of toe(s) (acquired), right foot: Secondary | ICD-10-CM | POA: Diagnosis not present

## 2024-07-28 DIAGNOSIS — M24572 Contracture, left ankle: Secondary | ICD-10-CM | POA: Diagnosis not present

## 2024-07-28 DIAGNOSIS — M205X2 Other deformities of toe(s) (acquired), left foot: Secondary | ICD-10-CM | POA: Diagnosis not present

## 2024-07-28 DIAGNOSIS — M7662 Achilles tendinitis, left leg: Secondary | ICD-10-CM | POA: Diagnosis not present

## 2024-08-03 NOTE — Therapy (Signed)
 OUTPATIENT PHYSICAL THERAPY BACK TREATMENT   Patient Name: Mary Short MRN: [id number] DOB:21-Mar-1951, 73 y.o., female Today's Date: 08/04/2024  END OF SESSION:  PT End of Session - 08/04/24 1146     Visit Number 2    Date for PT Re-Evaluation 10/13/24    Authorization Type HTA    PT Start Time 1145    PT Stop Time 1230    PT Time Calculation (min) 45 min           Past Medical History:  Diagnosis Date   Hyperlipidemia    Osteoporosis    Seasonal allergies    Past Surgical History:  Procedure Laterality Date   BASAL CELL CARCINOMA EXCISION     hysterectomy     Dr. MARLA Elspeth Bellman   WISDOM TOOTH EXTRACTION  1974   Patient Active Problem List   Diagnosis Date Noted   Osteoporosis 10/06/2018   Hyperlipidemia 10/06/2018   Overactive bladder 10/06/2018   Seasonal allergies 10/06/2018    PCP: Harlene An  REFERRING PROVIDER: Dorn Ned   REFERRING DIAG:  M54.9 (ICD-10-CM) - Dorsalgia, unspecified     THERAPY DIAG:  Chronic midline thoracic back pain  Joint stiffness of spine  Decreased functional activity tolerance  Muscle spasm of back  Muscle weakness (generalized)  Rationale for Evaluation and Treatment: Rehabilitation  ONSET DATE: 06/23/24 referral date  SUBJECTIVE:                                                                                                                                                                                                         SUBJECTIVE STATEMENT: Doing better. Was able to do some limited sowing. I tried to use my foam roller but it was hitting a spot that was really sore. The pec stretch seems to be helping.    PERTINENT HISTORY:  Cervical epidural injection  Osteoporosis   PAIN:  Are you having pain? Yes: NPRS scale: 8/10 when it does hurt Pain location: mid back, radiates around ribs on R side Pain description: spasms, sharp Aggravating factors: anything where my head is down and I have to  bend at the waist,  Relieving factors: hot shower, heat, pressure on the spine, laying flat  PRECAUTIONS: None  RED FLAGS: None     WEIGHT BEARING RESTRICTIONS: No  FALLS:  Has patient fallen in last 6 months? No  LIVING ENVIRONMENT: Lives with: lives with their family and lives with their spouse Lives in: House/apartment    OCCUPATION: Retired  PLOF: Independent  PATIENT GOALS: a cure that is long  lasting so I can continue to do activities   NEXT MD VISIT: will call after a reasonable amount of PT appts   OBJECTIVE:  Note: Objective measures were completed at Evaluation unless otherwise noted.  DIAGNOSTIC FINDINGS:  C4-C7 moderate stenosis and moderate to severe foraminal stenosis as well as loss of lordosis.   MRI of thoracic spine shows multilevel degenerative disease without significant stenosis or instability.   COGNITION: Overall cognitive status: Within functional limits for tasks assessed  SENSATION: WFL  PALPATION: TTP T8-T10, tightness in upper traps    CERVICAL ROM:   Active ROM A/PROM (deg) eval  Flexion WNL  Extension WNL  Right lateral flexion 25%  Left lateral flexion 25%  Right rotation 75%  Left rotation 60%   (Blank rows = not tested)  UPPER EXTREMITY ROM: WNL  UPPER EXTREMITY MMT: flexion and abd 3+/5 both sides, ER/IR 5/5    LUMBAR ROM:   Active ROM A/PROM (deg) eval  Flexion WFL  Extension WFL  Right lateral flexion Equal on both sides  Left lateral flexion Equal on both sides  Right rotation WFL  Left rotation WFL   (Blank rows = not tested)  LOWER EXTREMITY ROM: WFL   LOWER EXTREMITY MMT: 5/5 BLE   TREATMENT DATE:  08/04/24 NuStep L5x46mins  Shoulder rows and ext 2x10 Seated thoracic ext against half foam roll 2x10 UT stretch with 3# weight Shoulder flexion and abd 1# with foam roll against wall 2x10 Standing thoracic open book at wall 2x10 STM to upper traps, trigger point release on R UT   07/21/24 EVAL                                                                                                                                  PATIENT EDUCATION:  Education details: POC, HEP, posture education and how strengthening can help Person educated: Patient Education method: Explanation and Demonstration Education comprehension: verbalized understanding and returned demonstration  HOME EXERCISE PROGRAM: Access Code: [email or web address] URL: [email or web address] Date: 07/21/2024 Prepared by: Almetta Fam  Exercises - Cat Cow  - 1 x daily - 7 x weekly - 2 sets - 10 reps - Thoracic Extension Mobilization on Foam Roll  - 1 x daily - 7 x weekly - 2 sets - 10 reps - Standing Shoulder Row with Anchored Resistance  - 1 x daily - 7 x weekly - 2 sets - 10 reps - Shoulder extension with resistance - Neutral  - 1 x daily - 7 x weekly - 2 sets - 10 reps - Doorway Pec Stretch at 90 Degrees Abduction  - 1 x daily - 7 x weekly - 2 reps - 15 hold - Seated Cervical Sidebending Stretch  - 1 x daily - 7 x weekly - 2 reps - 15 hold  ASSESSMENT:  CLINICAL IMPRESSION: Patient is a 73 y.o. female who was seen today for physical therapy treatment for dorsalgia. She reports some relief  with manual pressure and extensions against foam roll. Worked on some postural and shoulder strengthening today, as well as thoracic mobility. She is tight in upper traps and has trigger points. Patient will benefit from PT to strengthening her postural back muscles to improve her activity tolerance and address her back pain for a longer lasting solution to her issues.    EVAL--She reports ongoing mid back pain for years. The pain is sharp and feels like spasms when it does occur. She reports after doing an activity that requires any slight flexion is difficulty-- this includes sowing, cooking, cleaning, etc. She has about a 10 min tolerance. Patient is limited with neck ROM, especially with lateral flexion. She also has some weakness  in bilateral shoulder flexion and abduction.   OBJECTIVE IMPAIRMENTS: decreased activity tolerance, decreased ROM, decreased strength, hypomobility, increased muscle spasms, improper body mechanics, and pain.    ACTIVITY LIMITATIONS: carrying, lifting, bending, and standing  PARTICIPATION LIMITATIONS: meal prep, cleaning, laundry, community activity, and yard work  PERSONAL FACTORS: Age and Time since onset of injury/illness/exacerbation are also affecting patient's functional outcome.   REHAB POTENTIAL: Good  CLINICAL DECISION MAKING: Stable/uncomplicated  EVALUATION COMPLEXITY: Low  GOALS: Goals reviewed with patient? Yes  SHORT TERM GOALS: Target date: 09/01/24  Patient will be independent with initial HEP.  Baseline:  Goal status: INITIAL   LONG TERM GOALS: Target date: 10/13/24  Patient will be independent with advanced/ongoing HEP to improve outcomes and carryover.  Baseline:  Goal status: INITIAL  2.  Patient will report 75% improvement in back pain to improve QOL.  Baseline: 8/10 at worst Goal status: INITIAL  3.  Patient will demonstrate full pain free cervical ROM for safety with driving.  Baseline: limited side bending and rotation Goal status: INITIAL  4.  Patient will increase cooking, sowing, and standing activity tolerance to 30 mins  Baseline: 10 mins Goal status: INITIAL  5.  Patient will improve shoulder flexion and abduction strength to 4+/5 or better   Baseline: 3+ Goal status: INITIAL   PLAN:  PT FREQUENCY: 1-2x/week  PT DURATION: 12 weeks  PLANNED INTERVENTIONS: 97110-Therapeutic exercises, 97530- Therapeutic activity, 97112- Neuromuscular re-education, 97535- Self Care, 02859- Manual therapy, G0283- Electrical stimulation (unattended), [phone]- Traction (mechanical), 20560 (1-2 muscles), 20561 (3+ muscles)- Dry Needling, Patient/Family education, Taping, Joint mobilization, Joint manipulation, Spinal manipulation, Spinal mobilization,  Cryotherapy, and Moist heat  PLAN FOR NEXT SESSION: thoracic joint mobilizations, postural strengthening, STM to upper traps and along paraspinal into thoracic    Almetta Fam, PT 08/04/2024, 12:27 PM

## 2024-08-04 ENCOUNTER — Ambulatory Visit: Attending: Nurse Practitioner

## 2024-08-04 DIAGNOSIS — M25611 Stiffness of right shoulder, not elsewhere classified: Secondary | ICD-10-CM | POA: Insufficient documentation

## 2024-08-04 DIAGNOSIS — R6889 Other general symptoms and signs: Secondary | ICD-10-CM | POA: Diagnosis not present

## 2024-08-04 DIAGNOSIS — M2560 Stiffness of unspecified joint, not elsewhere classified: Secondary | ICD-10-CM | POA: Diagnosis not present

## 2024-08-04 DIAGNOSIS — G8929 Other chronic pain: Secondary | ICD-10-CM | POA: Insufficient documentation

## 2024-08-04 DIAGNOSIS — M62830 Muscle spasm of back: Secondary | ICD-10-CM | POA: Diagnosis not present

## 2024-08-04 DIAGNOSIS — M6281 Muscle weakness (generalized): Secondary | ICD-10-CM | POA: Insufficient documentation

## 2024-08-04 DIAGNOSIS — M546 Pain in thoracic spine: Secondary | ICD-10-CM | POA: Diagnosis not present

## 2024-08-06 ENCOUNTER — Ambulatory Visit: Admitting: Physical Therapy

## 2024-08-06 DIAGNOSIS — G8929 Other chronic pain: Secondary | ICD-10-CM

## 2024-08-06 DIAGNOSIS — M546 Pain in thoracic spine: Secondary | ICD-10-CM | POA: Diagnosis not present

## 2024-08-06 DIAGNOSIS — M25611 Stiffness of right shoulder, not elsewhere classified: Secondary | ICD-10-CM

## 2024-08-06 DIAGNOSIS — R6889 Other general symptoms and signs: Secondary | ICD-10-CM

## 2024-08-06 DIAGNOSIS — M62830 Muscle spasm of back: Secondary | ICD-10-CM

## 2024-08-06 DIAGNOSIS — M2560 Stiffness of unspecified joint, not elsewhere classified: Secondary | ICD-10-CM

## 2024-08-06 DIAGNOSIS — M6281 Muscle weakness (generalized): Secondary | ICD-10-CM

## 2024-08-06 NOTE — Therapy (Signed)
 OUTPATIENT PHYSICAL THERAPY BACK TREATMENT   Patient Name: Sela Falk MRN: [id number] DOB:02-Feb-1951, 73 y.o., female Today's Date: 08/06/2024  END OF SESSION:  PT End of Session - 08/06/24 1445     Visit Number 3    Date for PT Re-Evaluation 10/13/24    Authorization Type HTA    PT Start Time 1445    PT Stop Time 1530    PT Time Calculation (min) 45 min           Past Medical History:  Diagnosis Date   Hyperlipidemia    Osteoporosis    Seasonal allergies    Past Surgical History:  Procedure Laterality Date   BASAL CELL CARCINOMA EXCISION     hysterectomy     Dr. MARLA Elspeth Bellman   WISDOM TOOTH EXTRACTION  1974   Patient Active Problem List   Diagnosis Date Noted   Osteoporosis 10/06/2018   Hyperlipidemia 10/06/2018   Overactive bladder 10/06/2018   Seasonal allergies 10/06/2018    PCP: Harlene An  REFERRING PROVIDER: Dorn Ned   REFERRING DIAG:  M54.9 (ICD-10-CM) - Dorsalgia, unspecified     THERAPY DIAG:  Chronic midline thoracic back pain  Joint stiffness of spine  Decreased functional activity tolerance  Muscle spasm of back  Muscle weakness (generalized)  Decreased range of motion of right shoulder  Rationale for Evaluation and Treatment: Rehabilitation  ONSET DATE: 06/23/24 referral date  SUBJECTIVE:                                                                                                                                                                                                         SUBJECTIVE STATEMENT:  I died yesterday after session, twisting flared up lower back pain comes and goes  PERTINENT HISTORY:  Cervical epidural injection  Osteoporosis   PAIN:  Are you having pain? Yes: NPRS scale: 8/10 when it does hurt Pain location: mid back, radiates around ribs on R side Pain description: spasms, sharp Aggravating factors: anything where my head is down and I have to bend at the waist,  Relieving  factors: hot shower, heat, pressure on the spine, laying flat  PRECAUTIONS: None  RED FLAGS: None     WEIGHT BEARING RESTRICTIONS: No  FALLS:  Has patient fallen in last 6 months? No  LIVING ENVIRONMENT: Lives with: lives with their family and lives with their spouse Lives in: House/apartment    OCCUPATION: Retired  PLOF: Independent  PATIENT GOALS: a cure that is long lasting so I can continue to do activities   NEXT MD  VISIT: will call after a reasonable amount of PT appts   OBJECTIVE:  Note: Objective measures were completed at Evaluation unless otherwise noted.  DIAGNOSTIC FINDINGS:  C4-C7 moderate stenosis and moderate to severe foraminal stenosis as well as loss of lordosis.   MRI of thoracic spine shows multilevel degenerative disease without significant stenosis or instability.   COGNITION: Overall cognitive status: Within functional limits for tasks assessed  SENSATION: WFL  PALPATION: TTP T8-T10, tightness in upper traps    CERVICAL ROM:   Active ROM A/PROM (deg) eval  Flexion WNL  Extension WNL  Right lateral flexion 25%  Left lateral flexion 25%  Right rotation 75%  Left rotation 60%   (Blank rows = not tested)  UPPER EXTREMITY ROM: WNL  UPPER EXTREMITY MMT: flexion and abd 3+/5 both sides, ER/IR 5/5    LUMBAR ROM:   Active ROM A/PROM (deg) eval  Flexion WFL  Extension WFL  Right lateral flexion Equal on both sides  Left lateral flexion Equal on both sides  Right rotation WFL  Left rotation WFL   (Blank rows = not tested)  LOWER EXTREMITY ROM: WFL   LOWER EXTREMITY MMT: 5/5 BLE   TREATMENT DATE:   08/06/24  UBE L 1 2 min each way Red tband standing shld ext and row 2 sets 10  Red tband horz abd and ER 2 sets 10 Ball vs wall 5 x CW and 5 xCCW Wt ball standing OH ext 10 x Self stretching for traps and rhomboids STW to UT and rhomboids Cupping to RT UT and rhomboid KT tape to RT UT and rhom TP with 60-70%  pull    08/04/24 NuStep L5x76mins  Shoulder rows and ext 2x10 Seated thoracic ext against half foam roll 2x10 UT stretch with 3# weight Shoulder flexion and abd 1# with foam roll against wall 2x10 Standing thoracic open book at wall 2x10 STM to upper traps, trigger point release on R UT   07/21/24 EVAL                                                                                                                                 PATIENT EDUCATION:  Education details: POC, HEP, posture education and how strengthening can help Person educated: Patient Education method: Explanation and Demonstration Education comprehension: verbalized understanding and returned demonstration  HOME EXERCISE PROGRAM: Access Code: [email or web address] URL: [email or web address] Date: 07/21/2024 Prepared by: Almetta Fam  Exercises - Cat Cow  - 1 x daily - 7 x weekly - 2 sets - 10 reps - Thoracic Extension Mobilization on Foam Roll  - 1 x daily - 7 x weekly - 2 sets - 10 reps - Standing Shoulder Row with Anchored Resistance  - 1 x daily - 7 x weekly - 2 sets - 10 reps - Shoulder extension with resistance - Neutral  - 1 x daily - 7 x weekly - 2 sets - 10 reps - Doorway Pec  Stretch at 90 Degrees Abduction  - 1 x daily - 7 x weekly - 2 reps - 15 hold - Seated Cervical Sidebending Stretch  - 1 x daily - 7 x weekly - 2 reps - 15 hold  ASSESSMENT:  CLINICAL IMPRESSION: STG met. Some increased pain from last session, maybe open book ex on wall Pt states pain comes and goes. Progressed gentle postural strengthening and STW with cuing. Issued red tband for home as she was using blue. TP RT trap and romb, responded well to STW and cupping. Trial of KT tape     EVAL--She reports ongoing mid back pain for years. The pain is sharp and feels like spasms when it does occur. She reports after doing an activity that requires any slight flexion is difficulty-- this includes sowing, cooking, cleaning, etc. She has  about a 10 min tolerance. Patient is limited with neck ROM, especially with lateral flexion. She also has some weakness in bilateral shoulder flexion and abduction.   OBJECTIVE IMPAIRMENTS: decreased activity tolerance, decreased ROM, decreased strength, hypomobility, increased muscle spasms, improper body mechanics, and pain.    ACTIVITY LIMITATIONS: carrying, lifting, bending, and standing  PARTICIPATION LIMITATIONS: meal prep, cleaning, laundry, community activity, and yard work  PERSONAL FACTORS: Age and Time since onset of injury/illness/exacerbation are also affecting patient's functional outcome.   REHAB POTENTIAL: Good  CLINICAL DECISION MAKING: Stable/uncomplicated  EVALUATION COMPLEXITY: Low  GOALS: Goals reviewed with patient? Yes  SHORT TERM GOALS: Target date: 09/01/24  Patient will be independent with initial HEP.  Baseline:  Goal status: 08/06/24 MET for initial HEP   LONG TERM GOALS: Target date: 10/13/24  Patient will be independent with advanced/ongoing HEP to improve outcomes and carryover.  Baseline:  Goal status: INITIAL  2.  Patient will report 75% improvement in back pain to improve QOL.  Baseline: 8/10 at worst Goal status: INITIAL  3.  Patient will demonstrate full pain free cervical ROM for safety with driving.  Baseline: limited side bending and rotation Goal status: INITIAL  4.  Patient will increase cooking, sowing, and standing activity tolerance to 30 mins  Baseline: 10 mins Goal status: INITIAL  5.  Patient will improve shoulder flexion and abduction strength to 4+/5 or better   Baseline: 3+ Goal status: INITIAL   PLAN:  PT FREQUENCY: 1-2x/week  PT DURATION: 12 weeks  PLANNED INTERVENTIONS: 97110-Therapeutic exercises, 97530- Therapeutic activity, 97112- Neuromuscular re-education, 97535- Self Care, 02859- Manual therapy, G0283- Electrical stimulation (unattended), 02987- Traction (mechanical), 20560 (1-2 muscles), 20561 (3+ muscles)-  Dry Needling, Patient/Family education, Taping, Joint mobilization, Joint manipulation, Spinal manipulation, Spinal mobilization, Cryotherapy, and Moist heat  PLAN FOR NEXT SESSION: postural strengthening. STW and mobs as needed  Artis Buechele,ANGIE, PTA 08/06/2024, 2:46 PM

## 2024-08-18 ENCOUNTER — Ambulatory Visit

## 2024-08-18 ENCOUNTER — Ambulatory Visit: Admitting: Physical Therapy

## 2024-08-18 DIAGNOSIS — M546 Pain in thoracic spine: Secondary | ICD-10-CM | POA: Diagnosis not present

## 2024-08-18 DIAGNOSIS — M2560 Stiffness of unspecified joint, not elsewhere classified: Secondary | ICD-10-CM

## 2024-08-18 DIAGNOSIS — M62830 Muscle spasm of back: Secondary | ICD-10-CM

## 2024-08-18 DIAGNOSIS — M6281 Muscle weakness (generalized): Secondary | ICD-10-CM

## 2024-08-18 DIAGNOSIS — M25611 Stiffness of right shoulder, not elsewhere classified: Secondary | ICD-10-CM

## 2024-08-18 DIAGNOSIS — G8929 Other chronic pain: Secondary | ICD-10-CM

## 2024-08-18 DIAGNOSIS — R6889 Other general symptoms and signs: Secondary | ICD-10-CM

## 2024-08-18 NOTE — Therapy (Signed)
 OUTPATIENT PHYSICAL THERAPY BACK TREATMENT   Patient Name: Mary Short MRN: [id number] DOB:05/14/1951, 73 y.o., female Today's Date: 08/18/2024  END OF SESSION:  PT End of Session - 08/18/24 1231     Visit Number 4    Date for PT Re-Evaluation 10/13/24    Authorization Type HTA    PT Start Time 1230    PT Stop Time 1315    PT Time Calculation (min) 45 min           Past Medical History:  Diagnosis Date   Hyperlipidemia    Osteoporosis    Seasonal allergies    Past Surgical History:  Procedure Laterality Date   BASAL CELL CARCINOMA EXCISION     hysterectomy     Dr. MARLA Elspeth Bellman   WISDOM TOOTH EXTRACTION  1974   Patient Active Problem List   Diagnosis Date Noted   Osteoporosis 10/06/2018   Hyperlipidemia 10/06/2018   Overactive bladder 10/06/2018   Seasonal allergies 10/06/2018    PCP: Harlene An  REFERRING PROVIDER: Dorn Ned   REFERRING DIAG:  M54.9 (ICD-10-CM) - Dorsalgia, unspecified     THERAPY DIAG:  Chronic midline thoracic back pain  Joint stiffness of spine  Decreased functional activity tolerance  Muscle spasm of back  Muscle weakness (generalized)  Decreased range of motion of right shoulder  Rationale for Evaluation and Treatment: Rehabilitation  ONSET DATE: 06/23/24 referral date  SUBJECTIVE:                                                                                                                                                                                                         SUBJECTIVE STATEMENT:  I am here. Really sore after last session. Moved daughter and did lots of steps.  PERTINENT HISTORY:  Cervical epidural injection  Osteoporosis   PAIN:  Are you having pain? 1/10   PRECAUTIONS: None  RED FLAGS: None     WEIGHT BEARING RESTRICTIONS: No  FALLS:  Has patient fallen in last 6 months? No  LIVING ENVIRONMENT: Lives with: lives with their family and lives with their spouse Lives  in: House/apartment    OCCUPATION: Retired  PLOF: Independent  PATIENT GOALS: a cure that is long lasting so I can continue to do activities   NEXT MD VISIT: will call after a reasonable amount of PT appts   OBJECTIVE:  Note: Objective measures were completed at Evaluation unless otherwise noted.  DIAGNOSTIC FINDINGS:  C4-C7 moderate stenosis and moderate to severe foraminal stenosis as well as loss of lordosis.   MRI of  thoracic spine shows multilevel degenerative disease without significant stenosis or instability.   COGNITION: Overall cognitive status: Within functional limits for tasks assessed  SENSATION: WFL  PALPATION: TTP T8-T10, tightness in upper traps    CERVICAL ROM:   Active ROM A/PROM (deg) eval AROM 08/18/24  Flexion WNL WNL  Extension WNL WNL  Right lateral flexion 25% 25%  Left lateral flexion 25% 25%  Right rotation 75% WNL  Left rotation 60% 75%   (Blank rows = not tested)  UPPER EXTREMITY ROM: WNL  UPPER EXTREMITY MMT: flexion and abd 3+/5 both sides, ER/IR 5/5    LUMBAR ROM:   Active ROM A/PROM (deg) eval  Flexion WFL  Extension WFL  Right lateral flexion Equal on both sides  Left lateral flexion Equal on both sides  Right rotation WFL  Left rotation WFL   (Blank rows = not tested)  LOWER EXTREMITY ROM: WFL   LOWER EXTREMITY MMT: 5/5 BLE   TREATMENT DATE:  08/18/24 Assess ROM and goals. Cerv ROM above in chart. Lumbar WFLs  BIL shld flex and abd 3+/5 Nustep L 5 3# standing shld ext 2 sets 10 Ball vs wall 5 x CW and 5 xCCW Seated row 15# 2 sets 10 Lat pull Down  15# 2 sets 10 Seated with back support shd flex 10 x, abd 10 x 3# 2 sets. 3# OH press 10 x 2 sets 10 3# standing upright row 2 sets 10 STW to UT and rhomboids Mulligan SNAGs and NAGS to increase cerv rotation and lateral flexion  08/06/24  UBE L 1 2 min each way Red tband standing shld ext and row 2 sets 10  Red tband horz abd and ER 2 sets 10 Ball vs  wall 5 x CW and 5 xCCW Wt ball standing OH ext 10 x Self stretching for traps and rhomboids STW to UT and rhomboids Cupping to RT UT and rhomboid KT tape to RT UT and rhom TP with 60-70% pull    08/04/24 NuStep L5x62mins  Shoulder rows and ext 2x10 Seated thoracic ext against half foam roll 2x10 UT stretch with 3# weight Shoulder flexion and abd 1# with foam roll against wall 2x10 Standing thoracic open book at wall 2x10 STM to upper traps, trigger point release on R UT   07/21/24 EVAL                                                                                                                                 PATIENT EDUCATION:  Education details: POC, HEP, posture education and how strengthening can help Person educated: Patient Education method: Explanation and Demonstration Education comprehension: verbalized understanding and returned demonstration  HOME EXERCISE PROGRAM: Access Code: [email or web address] URL: [email or web address] Date: 07/21/2024 Prepared by: Almetta Fam  Exercises - Cat Cow  - 1 x daily - 7 x weekly - 2 sets - 10 reps - Thoracic Extension Mobilization on Foam Roll  - 1  x daily - 7 x weekly - 2 sets - 10 reps - Standing Shoulder Row with Anchored Resistance  - 1 x daily - 7 x weekly - 2 sets - 10 reps - Shoulder extension with resistance - Neutral  - 1 x daily - 7 x weekly - 2 sets - 10 reps - Doorway Pec Stretch at 90 Degrees Abduction  - 1 x daily - 7 x weekly - 2 reps - 15 hold - Seated Cervical Sidebending Stretch  - 1 x daily - 7 x weekly - 2 reps - 15 hold  ASSESSMENT:  CLINICAL IMPRESSION: arrives after being gone moving daughter. Stated she did okay ,took an aleve every morning. States she was sore after last session. Pain continues to come and go. Progressed strengthening with cuing for postural awareness.     EVAL--She reports ongoing mid back pain for years. The pain is sharp and feels like spasms when it does occur. She reports  after doing an activity that requires any slight flexion is difficulty-- this includes sowing, cooking, cleaning, etc. She has about a 10 min tolerance. Patient is limited with neck ROM, especially with lateral flexion. She also has some weakness in bilateral shoulder flexion and abduction.   OBJECTIVE IMPAIRMENTS: decreased activity tolerance, decreased ROM, decreased strength, hypomobility, increased muscle spasms, improper body mechanics, and pain.    ACTIVITY LIMITATIONS: carrying, lifting, bending, and standing  PARTICIPATION LIMITATIONS: meal prep, cleaning, laundry, community activity, and yard work  PERSONAL FACTORS: Age and Time since onset of injury/illness/exacerbation are also affecting patient's functional outcome.   REHAB POTENTIAL: Good  CLINICAL DECISION MAKING: Stable/uncomplicated  EVALUATION COMPLEXITY: Low  GOALS: Goals reviewed with patient? Yes  SHORT TERM GOALS: Target date: 09/01/24  Patient will be independent with initial HEP.  Baseline:  Goal status: 08/06/24 MET for initial HEP   LONG TERM GOALS: Target date: 10/13/24  Patient will be independent with advanced/ongoing HEP to improve outcomes and carryover.  Baseline:  Goal status: 08/18/24 progressing  2.  Patient will report 75% improvement in back pain to improve QOL.  Baseline: 8/10 at worst Goal status: progressing 08/18/24  3.  Patient will demonstrate full pain free cervical ROM for safety with driving.  Baseline: limited side bending and rotation Goal status: progressing 08/18/24  4.  Patient will increase cooking, sowing, and standing activity tolerance to 30 mins  Baseline: 10 mins Goal status: INITIAL  5.  Patient will improve shoulder flexion and abduction strength to 4+/5 or better   Baseline: 3+ Goal status: 08/18/24 progressing   PLAN:  PT FREQUENCY: 1-2x/week  PT DURATION: 12 weeks  PLANNED INTERVENTIONS: 97110-Therapeutic exercises, 97530- Therapeutic activity, 97112-  Neuromuscular re-education, 97535- Self Care, [id number]- Manual therapy, [id number]- Electrical stimulation (unattended), [phone]- Traction (mechanical), 20560 (1-2 muscles), 20561 (3+ muscles)- Dry Needling, Patient/Family education, Taping, Joint mobilization, Joint manipulation, Spinal manipulation, Spinal mobilization, Cryotherapy, and Moist heat  PLAN FOR NEXT SESSION: postural strengthening. STW and mobs as needed  Melita Villalona,ANGIE, PTA 08/18/2024, 12:32 PM

## 2024-08-20 ENCOUNTER — Ambulatory Visit: Admitting: Physical Therapy

## 2024-08-20 DIAGNOSIS — M62830 Muscle spasm of back: Secondary | ICD-10-CM

## 2024-08-20 DIAGNOSIS — R6889 Other general symptoms and signs: Secondary | ICD-10-CM

## 2024-08-20 DIAGNOSIS — M6281 Muscle weakness (generalized): Secondary | ICD-10-CM

## 2024-08-20 DIAGNOSIS — M25611 Stiffness of right shoulder, not elsewhere classified: Secondary | ICD-10-CM

## 2024-08-20 DIAGNOSIS — M2560 Stiffness of unspecified joint, not elsewhere classified: Secondary | ICD-10-CM

## 2024-08-20 DIAGNOSIS — M546 Pain in thoracic spine: Secondary | ICD-10-CM | POA: Diagnosis not present

## 2024-08-20 DIAGNOSIS — G8929 Other chronic pain: Secondary | ICD-10-CM

## 2024-08-20 NOTE — Therapy (Signed)
 OUTPATIENT PHYSICAL THERAPY BACK TREATMENT   Patient Name: Mary Short MRN: [id number] DOB:Nov 14, 1951, 73 y.o., female Today's Date: 08/20/2024  END OF SESSION:  PT End of Session - 08/20/24 1016     Visit Number 5    Date for PT Re-Evaluation 10/13/24    Authorization Type HTA    PT Start Time 1015    PT Stop Time 1100    PT Time Calculation (min) 45 min           Past Medical History:  Diagnosis Date   Hyperlipidemia    Osteoporosis    Seasonal allergies    Past Surgical History:  Procedure Laterality Date   BASAL CELL CARCINOMA EXCISION     hysterectomy     Dr. MARLA Elspeth Bellman   WISDOM TOOTH EXTRACTION  1974   Patient Active Problem List   Diagnosis Date Noted   Osteoporosis 10/06/2018   Hyperlipidemia 10/06/2018   Overactive bladder 10/06/2018   Seasonal allergies 10/06/2018    PCP: Harlene An  REFERRING PROVIDER: Dorn Ned   REFERRING DIAG:  M54.9 (ICD-10-CM) - Dorsalgia, unspecified     THERAPY DIAG:  Chronic midline thoracic back pain  Joint stiffness of spine  Decreased functional activity tolerance  Muscle spasm of back  Muscle weakness (generalized)  Decreased range of motion of right shoulder  Rationale for Evaluation and Treatment: Rehabilitation  ONSET DATE: 06/23/24 referral date  SUBJECTIVE:                                                                                                                                                                                                         SUBJECTIVE STATEMENT:    No pain in spot I am here for. Did well after last session PERTINENT HISTORY:  Cervical epidural injection  Osteoporosis   PAIN:  Are you having pain? NO   PRECAUTIONS: None  RED FLAGS: None     WEIGHT BEARING RESTRICTIONS: No  FALLS:  Has patient fallen in last 6 months? No  LIVING ENVIRONMENT: Lives with: lives with their family and lives with their spouse Lives in: House/apartment     OCCUPATION: Retired  PLOF: Independent  PATIENT GOALS: a cure that is long lasting so I can continue to do activities   NEXT MD VISIT: will call after a reasonable amount of PT appts   OBJECTIVE:  Note: Objective measures were completed at Evaluation unless otherwise noted.  DIAGNOSTIC FINDINGS:  C4-C7 moderate stenosis and moderate to severe foraminal stenosis as well as loss of lordosis.   MRI of thoracic  spine shows multilevel degenerative disease without significant stenosis or instability.   COGNITION: Overall cognitive status: Within functional limits for tasks assessed  SENSATION: WFL  PALPATION: TTP T8-T10, tightness in upper traps    CERVICAL ROM:   Active ROM A/PROM (deg) eval AROM 08/18/24  Flexion WNL WNL  Extension WNL WNL  Right lateral flexion 25% 25%  Left lateral flexion 25% 25%  Right rotation 75% WNL  Left rotation 60% 75%   (Blank rows = not tested)  UPPER EXTREMITY ROM: WNL  UPPER EXTREMITY MMT: flexion and abd 3+/5 both sides, ER/IR 5/5    LUMBAR ROM:   Active ROM A/PROM (deg) eval  Flexion WFL  Extension WFL  Right lateral flexion Equal on both sides  Left lateral flexion Equal on both sides  Right rotation WFL  Left rotation WFL   (Blank rows = not tested)  LOWER EXTREMITY ROM: WFL   LOWER EXTREMITY MMT: 5/5 BLE   TREATMENT DATE:  08/20/24 UBE 3 min fwd/3 min backward L 2 Seated row 20# 2 sets 10 Lat pull Down 20# 2 sets 10 Chest press serratus 2 sets 10 3# standing cane ex 15 x each direction Seated with back support shd flex 10 x, abd 10 x 3# 2 sets. 3# OH press 10 x 2 sets 10 3# standing upright row 2 sets 10 STW to UT and rhomboids Mulligan SNAGs and NAGS to increase cerv rotation and lateral flexion   08/18/24 Assess ROM and goals. Cerv ROM above in chart. Lumbar WFLs  BIL shld flex and abd 3+/5 Nustep L 5 3# standing shld ext 2 sets 10 Ball vs wall 5 x CW and 5 xCCW Seated row 15# 2 sets 10 Lat pull  Down  15# 2 sets 10 Seated with back support shd flex 10 x, abd 10 x 3# 2 sets. 3# OH press 10 x 2 sets 10 3# standing upright row 2 sets 10 STW to UT and rhomboids Mulligan SNAGs and NAGS to increase cerv rotation and lateral flexion  08/06/24  UBE L 1 2 min each way Red tband standing shld ext and row 2 sets 10  Red tband horz abd and ER 2 sets 10 Ball vs wall 5 x CW and 5 xCCW Wt ball standing OH ext 10 x Self stretching for traps and rhomboids STW to UT and rhomboids Cupping to RT UT and rhomboid KT tape to RT UT and rhom TP with 60-70% pull    08/04/24 NuStep L5x24mins  Shoulder rows and ext 2x10 Seated thoracic ext against half foam roll 2x10 UT stretch with 3# weight Shoulder flexion and abd 1# with foam roll against wall 2x10 Standing thoracic open book at wall 2x10 STM to upper traps, trigger point release on R UT   07/21/24 EVAL  PATIENT EDUCATION:  Education details: POC, HEP, posture education and how strengthening can help Person educated: Patient Education method: Medical illustrator Education comprehension: verbalized understanding and returned demonstration  HOME EXERCISE PROGRAM: Access Code: [email or web address] URL: [email or web address] Date: 07/21/2024 Prepared by: Almetta Fam  Exercises - Cat Cow  - 1 x daily - 7 x weekly - 2 sets - 10 reps - Thoracic Extension Mobilization on Foam Roll  - 1 x daily - 7 x weekly - 2 sets - 10 reps - Standing Shoulder Row with Anchored Resistance  - 1 x daily - 7 x weekly - 2 sets - 10 reps - Shoulder extension with resistance - Neutral  - 1 x daily - 7 x weekly - 2 sets - 10 reps - Doorway Pec Stretch at 90 Degrees Abduction  - 1 x daily - 7 x weekly - 2 reps - 15 hold - Seated Cervical Sidebending Stretch  - 1 x daily - 7 x weekly - 2 reps - 15 hold  ASSESSMENT:  CLINICAL  IMPRESSION: pt arrived feeling good, no pain at the moment in the area she is her for. Progressed strength with added weight. Cerv tightness with rotation and lateral flexion with tightness RT trap and rhomboid.    EVAL--She reports ongoing mid back pain for years. The pain is sharp and feels like spasms when it does occur. She reports after doing an activity that requires any slight flexion is difficulty-- this includes sowing, cooking, cleaning, etc. She has about a 10 min tolerance. Patient is limited with neck ROM, especially with lateral flexion. She also has some weakness in bilateral shoulder flexion and abduction.   OBJECTIVE IMPAIRMENTS: decreased activity tolerance, decreased ROM, decreased strength, hypomobility, increased muscle spasms, improper body mechanics, and pain.    ACTIVITY LIMITATIONS: carrying, lifting, bending, and standing  PARTICIPATION LIMITATIONS: meal prep, cleaning, laundry, community activity, and yard work  PERSONAL FACTORS: Age and Time since onset of injury/illness/exacerbation are also affecting patient's functional outcome.   REHAB POTENTIAL: Good  CLINICAL DECISION MAKING: Stable/uncomplicated  EVALUATION COMPLEXITY: Low  GOALS: Goals reviewed with patient? Yes  SHORT TERM GOALS: Target date: 09/01/24  Patient will be independent with initial HEP.  Baseline:  Goal status: 08/06/24 MET for initial HEP   LONG TERM GOALS: Target date: 10/13/24  Patient will be independent with advanced/ongoing HEP to improve outcomes and carryover.  Baseline:  Goal status: 08/18/24 progressing  2.  Patient will report 75% improvement in back pain to improve QOL.  Baseline: 8/10 at worst Goal status: progressing 08/18/24  3.  Patient will demonstrate full pain free cervical ROM for safety with driving.  Baseline: limited side bending and rotation Goal status: progressing 08/18/24  4.  Patient will increase cooking, sowing, and standing activity tolerance to 30  mins  Baseline: 10 mins Goal status: INITIAL  5.  Patient will improve shoulder flexion and abduction strength to 4+/5 or better   Baseline: 3+ Goal status: 08/18/24 progressing   PLAN:  PT FREQUENCY: 1-2x/week  PT DURATION: 12 weeks  PLANNED INTERVENTIONS: 97110-Therapeutic exercises, 97530- Therapeutic activity, 97112- Neuromuscular re-education, 97535- Self Care, 02859- Manual therapy, G0283- Electrical stimulation (unattended), [id number]- Traction (mechanical), 20560 (1-2 muscles), 20561 (3+ muscles)- Dry Needling, Patient/Family education, Taping, Joint mobilization, Joint manipulation, Spinal manipulation, Spinal mobilization, Cryotherapy, and Moist heat  PLAN FOR NEXT SESSION: postural strengthening. STW and mobs as needed  Miah Boye,ANGIE, PTA 08/20/2024, 10:16 AM

## 2024-09-10 ENCOUNTER — Ambulatory Visit: Attending: Nurse Practitioner | Admitting: Physical Therapy

## 2024-09-10 DIAGNOSIS — M546 Pain in thoracic spine: Secondary | ICD-10-CM | POA: Insufficient documentation

## 2024-09-10 DIAGNOSIS — M6281 Muscle weakness (generalized): Secondary | ICD-10-CM | POA: Insufficient documentation

## 2024-09-10 DIAGNOSIS — R6889 Other general symptoms and signs: Secondary | ICD-10-CM | POA: Diagnosis not present

## 2024-09-10 DIAGNOSIS — G8929 Other chronic pain: Secondary | ICD-10-CM | POA: Insufficient documentation

## 2024-09-10 DIAGNOSIS — M2560 Stiffness of unspecified joint, not elsewhere classified: Secondary | ICD-10-CM | POA: Diagnosis not present

## 2024-09-10 NOTE — Therapy (Signed)
 OUTPATIENT PHYSICAL THERAPY BACK TREATMENT   Patient Name: Radie Berges MRN: [id number] DOB:[birth year], 73 y.o., female Today's Date: 09/10/2024  END OF SESSION:  PT End of Session - 09/10/24 1142     Visit Number 6    Date for PT Re-Evaluation 10/13/24    Authorization Type HTA    PT Start Time 1143    PT Stop Time 1225    PT Time Calculation (min) 42 min           Past Medical History:  Diagnosis Date   Hyperlipidemia    Osteoporosis    Seasonal allergies    Past Surgical History:  Procedure Laterality Date   BASAL CELL CARCINOMA EXCISION     hysterectomy     Dr. MARLA Elspeth Bellman   WISDOM TOOTH EXTRACTION  1974   Patient Active Problem List   Diagnosis Date Noted   Osteoporosis 10/06/2018   Hyperlipidemia 10/06/2018   Overactive bladder 10/06/2018   Seasonal allergies 10/06/2018    PCP: Harlene An  REFERRING PROVIDER: Dorn Ned   REFERRING DIAG:  M54.9 (ICD-10-CM) - Dorsalgia, unspecified     THERAPY DIAG:  Chronic midline thoracic back pain  Joint stiffness of spine  Decreased functional activity tolerance  Rationale for Evaluation and Treatment: Rehabilitation  ONSET DATE: 06/23/24 referral date  SUBJECTIVE:                                                                                                                                                                                                         SUBJECTIVE STATEMENT:   50% better overall. Was traveling and did well. Aches and pains other places  PERTINENT HISTORY:  Cervical epidural injection  Osteoporosis   PAIN:  Are you having pain? NO   PRECAUTIONS: None  RED FLAGS: None     WEIGHT BEARING RESTRICTIONS: No  FALLS:  Has patient fallen in last 6 months? No  LIVING ENVIRONMENT: Lives with: lives with their family and lives with their spouse Lives in: House/apartment    OCCUPATION: Retired  PLOF: Independent  PATIENT GOALS: a cure that is long  lasting so I can continue to do activities   NEXT MD VISIT: will call after a reasonable amount of PT appts   OBJECTIVE:  Note: Objective measures were completed at Evaluation unless otherwise noted.  DIAGNOSTIC FINDINGS:  C4-C7 moderate stenosis and moderate to severe foraminal stenosis as well as loss of lordosis.   MRI of thoracic spine shows multilevel degenerative disease without significant stenosis or instability.   COGNITION: Overall cognitive status: Within  functional limits for tasks assessed  SENSATION: WFL  PALPATION: TTP T8-T10, tightness in upper traps    CERVICAL ROM:   Active ROM A/PROM (deg) eval AROM 08/18/24 09/10/24   Flexion WNL WNL WNL  Extension WNL WNL WNL  Right lateral flexion 25% 25% WFl  Left lateral flexion 25% 25% WFL  Right rotation 75% WNL WNL  Left rotation 60% 75% Limited  20% pain   (Blank rows = not tested)  UPPER EXTREMITY ROM: WNL  UPPER EXTREMITY MMT: flexion and abd 3+/5 both sides, ER/IR 5/5    LUMBAR ROM:   Active ROM A/PROM (deg) eval AROM 09/10/24  Flexion WFL WFL  Extension [facility] WFL  Right lateral flexion Equal on both sides WFL  Left lateral flexion Equal on both sides WFL  Right rotation [facility] WFL  Left rotation [facility] WFL   (Blank rows = not tested)  LOWER EXTREMITY ROM: WFL   LOWER EXTREMITY MMT: 5/5 BLE   TREATMENT DATE: 09/10/24 Cerv and Lumb ROM checked - see charts BIL UE MMT = and WFLa UBE L 3  fwd/3 min back Seated row 20# 2 sets 10 Lat pull Down 20# 2 sets 10 Chest press serratus 2 sets 10 10# Upright row 15# 2 sets 10 3# bil shld flex 2 sets 10 then abd 2 sets 10 10# cable pulley ext 2 sets 10 3# 3 pt rhy stab - some pain and fatigue STW to BIl cerv and trap- tightness in traps noted  08/20/24 UBE 3 min fwd/3 min backward L 2 Seated row 20# 2 sets 10 Lat pull Down 20# 2 sets 10 Chest press serratus 2 sets 10 3# standing cane ex 15 x each direction Seated with back support shd flex 10 x,  abd 10 x 3# 2 sets. 3# OH press 10 x 2 sets 10 3# standing upright row 2 sets 10 STW to UT and rhomboids Mulligan SNAGs and NAGS to increase cerv rotation and lateral flexion   08/18/24 Assess ROM and goals. Cerv ROM above in chart. Lumbar WFLs  BIL shld flex and abd 3+/5 Nustep L 5 3# standing shld ext 2 sets 10 Ball vs wall 5 x CW and 5 xCCW Seated row 15# 2 sets 10 Lat pull Down  15# 2 sets 10 Seated with back support shd flex 10 x, abd 10 x 3# 2 sets. 3# OH press 10 x 2 sets 10 3# standing upright row 2 sets 10 STW to UT and rhomboids Mulligan SNAGs and NAGS to increase cerv rotation and lateral flexion  08/06/24  UBE L 1 2 min each way Red tband standing shld ext and row 2 sets 10  Red tband horz abd and ER 2 sets 10 Ball vs wall 5 x CW and 5 xCCW Wt ball standing OH ext 10 x Self stretching for traps and rhomboids STW to UT and rhomboids Cupping to RT UT and rhomboid KT tape to RT UT and rhom TP with 60-70% pull    08/04/24 NuStep L5x43mins  Shoulder rows and ext 2x10 Seated thoracic ext against half foam roll 2x10 UT stretch with 3# weight Shoulder flexion and abd 1# with foam roll against wall 2x10 Standing thoracic open book at wall 2x10 STM to upper traps, trigger point release on R UT   07/21/24 EVAL  PATIENT EDUCATION:  Education details: POC, HEP, posture education and how strengthening can help Person educated: Patient Education method: Medical illustrator Education comprehension: verbalized understanding and returned demonstration  HOME EXERCISE PROGRAM: Access Code: [email or web address] URL: [email or web address] Date: 07/21/2024 Prepared by: Almetta Fam  Exercises - Cat Cow  - 1 x daily - 7 x weekly - 2 sets - 10 reps - Thoracic Extension Mobilization on Foam Roll  - 1 x daily - 7 x weekly - 2 sets - 10  reps - Standing Shoulder Row with Anchored Resistance  - 1 x daily - 7 x weekly - 2 sets - 10 reps - Shoulder extension with resistance - Neutral  - 1 x daily - 7 x weekly - 2 sets - 10 reps - Doorway Pec Stretch at 90 Degrees Abduction  - 1 x daily - 7 x weekly - 2 reps - 15 hold - Seated Cervical Sidebending Stretch  - 1 x daily - 7 x weekly - 2 reps - 15 hold  ASSESSMENT:  CLINICAL IMPRESSION: pt arrived feeling good, no pain at the moment in the area she is her for. Pt has been inconsistent with PT d/t travel and moving daughter. Overall 50% better and goals assessed and progressing. Will continue over next 2 weeks working on strength and func and assure no flare up.   EVAL--She reports ongoing mid back pain for years. The pain is sharp and feels like spasms when it does occur. She reports after doing an activity that requires any slight flexion is difficulty-- this includes sowing, cooking, cleaning, etc. She has about a 10 min tolerance. Patient is limited with neck ROM, especially with lateral flexion. She also has some weakness in bilateral shoulder flexion and abduction.   OBJECTIVE IMPAIRMENTS: decreased activity tolerance, decreased ROM, decreased strength, hypomobility, increased muscle spasms, improper body mechanics, and pain.    ACTIVITY LIMITATIONS: carrying, lifting, bending, and standing  PARTICIPATION LIMITATIONS: meal prep, cleaning, laundry, community activity, and yard work  PERSONAL FACTORS: Age and Time since onset of injury/illness/exacerbation are also affecting patient's functional outcome.   REHAB POTENTIAL: Good  CLINICAL DECISION MAKING: Stable/uncomplicated  EVALUATION COMPLEXITY: Low  GOALS: Goals reviewed with patient? Yes  SHORT TERM GOALS: Target date: 09/01/24  Patient will be independent with initial HEP.  Baseline:  Goal status: 08/06/24 MET for initial HEP   LONG TERM GOALS: Target date: 10/13/24  Patient will be independent with  advanced/ongoing HEP to improve outcomes and carryover.  Baseline:  Goal status: 08/18/24 progressing  2.  Patient will report 75% improvement in back pain to improve QOL.  Baseline: 8/10 at worst Goal status: progressing 08/18/24  09/10/24 50% progressing  3.  Patient will demonstrate full pain free cervical ROM for safety with driving.  Baseline: limited side bending and rotation Goal status: progressing 08/18/24  09/10/24 met except for left rotation  4.  Patient will increase cooking, sowing, and standing activity tolerance to 30 mins  Baseline: 10 mins Goal status: MET 09/10/24  5.  Patient will improve shoulder flexion and abduction strength to 4+/5 or better   Baseline: 3+ Goal status: 08/18/24 progressing   MET 09/10/24  PLAN:  PT FREQUENCY: 1-2x/week  PT DURATION: 12 weeks  PLANNED INTERVENTIONS: 97110-Therapeutic exercises, 97530- Therapeutic activity, 97112- Neuromuscular re-education, 97535- Self Care, [id number]- Manual therapy, [id number]- Electrical stimulation (unattended), [phone]- Traction (mechanical), 20560 (1-2 muscles), 20561 (3+ muscles)- Dry Needling, Patient/Family education, Taping, Joint mobilization, Joint manipulation, Spinal manipulation, Spinal mobilization, Cryotherapy, and Moist heat  PLAN FOR NEXT SESSION: postural strengthening. STW and mobs as needed  Mackensie Pilson,ANGIE, PTA 09/10/2024, 11:43 AM

## 2024-09-14 NOTE — Therapy (Signed)
 OUTPATIENT PHYSICAL THERAPY BACK TREATMENT   Patient Name: Winola Drum MRN: [id number] DOB:April 11, 1951, 73 y.o., female Today's Date: 09/15/2024  END OF SESSION:  PT End of Session - 09/15/24 1100     Visit Number 7    Date for PT Re-Evaluation 10/13/24    Authorization Type HTA    PT Start Time 1100    PT Stop Time 1145    PT Time Calculation (min) 45 min            Past Medical History:  Diagnosis Date   Hyperlipidemia    Osteoporosis    Seasonal allergies    Past Surgical History:  Procedure Laterality Date   BASAL CELL CARCINOMA EXCISION     hysterectomy     Dr. MARLA Elspeth Bellman   WISDOM TOOTH EXTRACTION  1974   Patient Active Problem List   Diagnosis Date Noted   Osteoporosis 10/06/2018   Hyperlipidemia 10/06/2018   Overactive bladder 10/06/2018   Seasonal allergies 10/06/2018    PCP: Harlene An  REFERRING PROVIDER: Dorn Ned   REFERRING DIAG:  M54.9 (ICD-10-CM) - Dorsalgia, unspecified     THERAPY DIAG:  Chronic midline thoracic back pain  Joint stiffness of spine  Muscle weakness (generalized)  Rationale for Evaluation and Treatment: Rehabilitation  ONSET DATE: 06/23/24 referral date  SUBJECTIVE:                                                                                                                                                                                                         SUBJECTIVE STATEMENT:   a little bit of a stab in the middle of the shoulder blades. It is new feels like a pinch.   PERTINENT HISTORY:  Cervical epidural injection  Osteoporosis   PAIN:  Are you having pain? NO   PRECAUTIONS: None  RED FLAGS: None     WEIGHT BEARING RESTRICTIONS: No  FALLS:  Has patient fallen in last 6 months? No  LIVING ENVIRONMENT: Lives with: lives with their family and lives with their spouse Lives in: House/apartment    OCCUPATION: Retired  PLOF: Independent  PATIENT GOALS: a cure that is long  lasting so I can continue to do activities   NEXT MD VISIT: will call after a reasonable amount of PT appts   OBJECTIVE:  Note: Objective measures were completed at Evaluation unless otherwise noted.  DIAGNOSTIC FINDINGS:  C4-C7 moderate stenosis and moderate to severe foraminal stenosis as well as loss of lordosis.   MRI of thoracic spine shows multilevel degenerative disease without significant stenosis or  instability.   COGNITION: Overall cognitive status: Within functional limits for tasks assessed  SENSATION: WFL  PALPATION: TTP T8-T10, tightness in upper traps    CERVICAL ROM:   Active ROM A/PROM (deg) eval AROM 08/18/24 09/10/24   Flexion WNL WNL WNL  Extension WNL WNL WNL  Right lateral flexion 25% 25% WFl  Left lateral flexion 25% 25% WFL  Right rotation 75% WNL WNL  Left rotation 60% 75% Limited  20% pain   (Blank rows = not tested)  UPPER EXTREMITY ROM: WNL  UPPER EXTREMITY MMT: flexion and abd 3+/5 both sides, ER/IR 5/5    LUMBAR ROM:   Active ROM A/PROM (deg) eval AROM 09/10/24  Flexion WFL WFL  Extension [facility] WFL  Right lateral flexion Equal on both sides WFL  Left lateral flexion Equal on both sides WFL  Right rotation [facility] WFL  Left rotation [facility] WFL   (Blank rows = not tested)  LOWER EXTREMITY ROM: WFL   LOWER EXTREMITY MMT: 5/5 BLE   TREATMENT DATE: 09/15/24 Rows and lats 25# 2x10 Chest press 10# 2x10 Shoulder ext 10# 2x10 UBE L3 x52mins each way  Shoulder flexion to abd 2# 2x10 Farmer's carry 10# 2 laps each hand Banded flexion lifts 2x10 OHP yellow ball 2x10 Scapular squeeze green 2x10  Horizontal abd green 2x10   09/10/24 Cerv and Lumb ROM checked - see charts BIL UE MMT = and WFLa UBE L 3  fwd/3 min back Seated row 20# 2 sets 10 Lat pull Down 20# 2 sets 10 Chest press serratus 2 sets 10 10# Upright row 15# 2 sets 10 3# bil shld flex 2 sets 10 then abd 2 sets 10 10# cable pulley ext 2 sets 10 3# 3 pt rhy stab - some  pain and fatigue STW to BIl cerv and trap- tightness in traps noted  08/20/24 UBE 3 min fwd/3 min backward L 2 Seated row 20# 2 sets 10 Lat pull Down 20# 2 sets 10 Chest press serratus 2 sets 10 3# standing cane ex 15 x each direction Seated with back support shd flex 10 x, abd 10 x 3# 2 sets. 3# OH press 10 x 2 sets 10 3# standing upright row 2 sets 10 STW to UT and rhomboids Mulligan SNAGs and NAGS to increase cerv rotation and lateral flexion   08/18/24 Assess ROM and goals. Cerv ROM above in chart. Lumbar WFLs  BIL shld flex and abd 3+/5 Nustep L 5 3# standing shld ext 2 sets 10 Ball vs wall 5 x CW and 5 xCCW Seated row 15# 2 sets 10 Lat pull Down  15# 2 sets 10 Seated with back support shd flex 10 x, abd 10 x 3# 2 sets. 3# OH press 10 x 2 sets 10 3# standing upright row 2 sets 10 STW to UT and rhomboids Mulligan SNAGs and NAGS to increase cerv rotation and lateral flexion  08/06/24  UBE L 1 2 min each way Red tband standing shld ext and row 2 sets 10  Red tband horz abd and ER 2 sets 10 Ball vs wall 5 x CW and 5 xCCW Wt ball standing OH ext 10 x Self stretching for traps and rhomboids STW to UT and rhomboids Cupping to RT UT and rhomboid KT tape to RT UT and rhom TP with 60-70% pull    08/04/24 NuStep L5x56mins  Shoulder rows and ext 2x10 Seated thoracic ext against half foam roll 2x10 UT stretch with 3# weight Shoulder flexion  and abd 1# with foam roll against wall 2x10 Standing thoracic open book at wall 2x10 STM to upper traps, trigger point release on R UT   07/21/24 EVAL                                                                                                                                 PATIENT EDUCATION:  Education details: POC, HEP, posture education and how strengthening can help Person educated: Patient Education method: Medical illustrator Education comprehension: verbalized understanding and returned demonstration  HOME  EXERCISE PROGRAM: Access Code: [email or web address] URL: [email or web address] Date: 07/21/2024 Prepared by: Almetta Fam  Exercises - Cat Cow  - 1 x daily - 7 x weekly - 2 sets - 10 reps - Thoracic Extension Mobilization on Foam Roll  - 1 x daily - 7 x weekly - 2 sets - 10 reps - Standing Shoulder Row with Anchored Resistance  - 1 x daily - 7 x weekly - 2 sets - 10 reps - Shoulder extension with resistance - Neutral  - 1 x daily - 7 x weekly - 2 sets - 10 reps - Doorway Pec Stretch at 90 Degrees Abduction  - 1 x daily - 7 x weekly - 2 reps - 15 hold - Seated Cervical Sidebending Stretch  - 1 x daily - 7 x weekly - 2 reps - 15 hold  ASSESSMENT:  CLINICAL IMPRESSION: pt arrived feeling good, some stabbing pain in between shoulder blades. She feels the PT has been helping so far, is thinking about renewing [employer] membership to continue with exercises after she finishes here. Will continue over next few visits working on strength and function and assure no flare up.   EVAL--She reports ongoing mid back pain for years. The pain is sharp and feels like spasms when it does occur. She reports after doing an activity that requires any slight flexion is difficulty-- this includes sowing, cooking, cleaning, etc. She has about a 10 min tolerance. Patient is limited with neck ROM, especially with lateral flexion. She also has some weakness in bilateral shoulder flexion and abduction.   OBJECTIVE IMPAIRMENTS: decreased activity tolerance, decreased ROM, decreased strength, hypomobility, increased muscle spasms, improper body mechanics, and pain.    ACTIVITY LIMITATIONS: carrying, lifting, bending, and standing  PARTICIPATION LIMITATIONS: meal prep, cleaning, laundry, community activity, and yard work  PERSONAL FACTORS: Age and Time since onset of injury/illness/exacerbation are also affecting patient's functional outcome.   REHAB POTENTIAL: Good  CLINICAL DECISION MAKING:  Stable/uncomplicated  EVALUATION COMPLEXITY: Low  GOALS: Goals reviewed with patient? Yes  SHORT TERM GOALS: Target date: 09/01/24  Patient will be independent with initial HEP.  Baseline:  Goal status: 08/06/24 MET for initial HEP   LONG TERM GOALS: Target date: 10/13/24  Patient will be independent with advanced/ongoing HEP to improve outcomes and carryover.  Baseline:  Goal status: 08/18/24 progressing  2.  Patient will report 75% improvement in back pain  to improve QOL.  Baseline: 8/10 at worst Goal status: progressing 08/18/24  09/10/24 50% progressing  3.  Patient will demonstrate full pain free cervical ROM for safety with driving.  Baseline: limited side bending and rotation Goal status: progressing 08/18/24  09/10/24 met except for left rotation  4.  Patient will increase cooking, sowing, and standing activity tolerance to 30 mins  Baseline: 10 mins Goal status: MET 09/10/24  5.  Patient will improve shoulder flexion and abduction strength to 4+/5 or better   Baseline: 3+ Goal status: 08/18/24 progressing   MET 09/10/24  PLAN:  PT FREQUENCY: 1-2x/week  PT DURATION: 12 weeks  PLANNED INTERVENTIONS: 97110-Therapeutic exercises, 97530- Therapeutic activity, 97112- Neuromuscular re-education, 97535- Self Care, 02859- Manual therapy, G0283- Electrical stimulation (unattended), [phone]- Traction (mechanical), 20560 (1-2 muscles), 20561 (3+ muscles)- Dry Needling, Patient/Family education, Taping, Joint mobilization, Joint manipulation, Spinal manipulation, Spinal mobilization, Cryotherapy, and Moist heat  PLAN FOR NEXT SESSION: postural strengthening. STW and mobs as needed  [employer], PT 09/15/2024, 11:40 AM

## 2024-09-15 ENCOUNTER — Ambulatory Visit

## 2024-09-15 DIAGNOSIS — M2560 Stiffness of unspecified joint, not elsewhere classified: Secondary | ICD-10-CM

## 2024-09-15 DIAGNOSIS — M546 Pain in thoracic spine: Secondary | ICD-10-CM | POA: Diagnosis not present

## 2024-09-15 DIAGNOSIS — G8929 Other chronic pain: Secondary | ICD-10-CM

## 2024-09-15 DIAGNOSIS — M6281 Muscle weakness (generalized): Secondary | ICD-10-CM

## 2024-09-17 ENCOUNTER — Ambulatory Visit

## 2024-09-17 DIAGNOSIS — M6281 Muscle weakness (generalized): Secondary | ICD-10-CM

## 2024-09-17 DIAGNOSIS — G8929 Other chronic pain: Secondary | ICD-10-CM

## 2024-09-17 DIAGNOSIS — M546 Pain in thoracic spine: Secondary | ICD-10-CM | POA: Diagnosis not present

## 2024-09-17 NOTE — Therapy (Signed)
 OUTPATIENT PHYSICAL THERAPY BACK TREATMENT   Patient Name: Mary Short MRN: [id number] DOB:09-18-1951, 73 y.o., female Today's Date: 09/17/2024  END OF SESSION:  PT End of Session - 09/17/24 1100     Visit Number 8    Date for Recertification  10/13/24    Authorization Type HTA    PT Start Time 1100    PT Stop Time 1145    PT Time Calculation (min) 45 min             Past Medical History:  Diagnosis Date   Hyperlipidemia    Osteoporosis    Seasonal allergies    Past Surgical History:  Procedure Laterality Date   BASAL CELL CARCINOMA EXCISION     hysterectomy     Dr. MARLA Elspeth Bellman   WISDOM TOOTH EXTRACTION  1974   Patient Active Problem List   Diagnosis Date Noted   Osteoporosis 10/06/2018   Hyperlipidemia 10/06/2018   Overactive bladder 10/06/2018   Seasonal allergies 10/06/2018    PCP: Harlene An  REFERRING PROVIDER: Dorn Ned   REFERRING DIAG:  M54.9 (ICD-10-CM) - Dorsalgia, unspecified     THERAPY DIAG:  Muscle weakness (generalized)  Chronic midline thoracic back pain  Rationale for Evaluation and Treatment: Rehabilitation  ONSET DATE: 06/23/24 referral date  SUBJECTIVE:                                                                                                                                                                                                         SUBJECTIVE STATEMENT: I feel great. No pain.   PERTINENT HISTORY:  Cervical epidural injection  Osteoporosis   PAIN:  Are you having pain? NO   PRECAUTIONS: None  RED FLAGS: None     WEIGHT BEARING RESTRICTIONS: No  FALLS:  Has patient fallen in last 6 months? No  LIVING ENVIRONMENT: Lives with: lives with their family and lives with their spouse Lives in: House/apartment    OCCUPATION: Retired  PLOF: Independent  PATIENT GOALS: a cure that is long lasting so I can continue to do activities   NEXT MD VISIT: will call after a reasonable  amount of PT appts   OBJECTIVE:  Note: Objective measures were completed at Evaluation unless otherwise noted.  DIAGNOSTIC FINDINGS:  C4-C7 moderate stenosis and moderate to severe foraminal stenosis as well as loss of lordosis.   MRI of thoracic spine shows multilevel degenerative disease without significant stenosis or instability.   COGNITION: Overall cognitive status: Within functional limits for tasks assessed  SENSATION: WFL  PALPATION: TTP T8-T10, tightness  in upper traps    CERVICAL ROM:   Active ROM A/PROM (deg) eval AROM 08/18/24 09/10/24   Flexion WNL WNL WNL  Extension WNL WNL WNL  Right lateral flexion 25% 25% WFl  Left lateral flexion 25% 25% WFL  Right rotation 75% WNL WNL  Left rotation 60% 75% Limited  20% pain   (Blank rows = not tested)  UPPER EXTREMITY ROM: WNL  UPPER EXTREMITY MMT: flexion and abd 3+/5 both sides, ER/IR 5/5    LUMBAR ROM:   Active ROM A/PROM (deg) eval AROM 09/10/24  Flexion WFL WFL  Extension [facility] WFL  Right lateral flexion Equal on both sides WFL  Left lateral flexion Equal on both sides WFL  Right rotation [facility] WFL  Left rotation [facility] WFL   (Blank rows = not tested)  LOWER EXTREMITY ROM: WFL   LOWER EXTREMITY MMT: 5/5 BLE   TREATMENT DATE: 09/17/24 UBE L3 x23min each way  Shoulder ext 10# 2x10 AR press 10# 2x10 Rows and lats 25# 2x10 3 way band reaches 2x10 Overhead carry 2 laps each side 4#  STS with chest press 2x10 3 way extension 2# 2x10  09/15/24 Rows and lats 25# 2x10 Chest press 10# 2x10 Shoulder ext 10# 2x10 UBE L3 x10mins each way  Shoulder flexion to abd 2# 2x10 Farmer's carry 10# 2 laps each hand Banded flexion lifts 2x10 OHP yellow ball 2x10 Scapular squeeze green 2x10  Horizontal abd green 2x10   09/10/24 Cerv and Lumb ROM checked - see charts BIL UE MMT = and WFLa UBE L 3  fwd/3 min back Seated row 20# 2 sets 10 Lat pull Down 20# 2 sets 10 Chest press serratus 2 sets 10 10# Upright  row 15# 2 sets 10 3# bil shld flex 2 sets 10 then abd 2 sets 10 10# cable pulley ext 2 sets 10 3# 3 pt rhy stab - some pain and fatigue STW to BIl cerv and trap- tightness in traps noted  08/20/24 UBE 3 min fwd/3 min backward L 2 Seated row 20# 2 sets 10 Lat pull Down 20# 2 sets 10 Chest press serratus 2 sets 10 3# standing cane ex 15 x each direction Seated with back support shd flex 10 x, abd 10 x 3# 2 sets. 3# OH press 10 x 2 sets 10 3# standing upright row 2 sets 10 STW to UT and rhomboids Mulligan SNAGs and NAGS to increase cerv rotation and lateral flexion   08/18/24 Assess ROM and goals. Cerv ROM above in chart. Lumbar WFLs  BIL shld flex and abd 3+/5 Nustep L 5 3# standing shld ext 2 sets 10 Ball vs wall 5 x CW and 5 xCCW Seated row 15# 2 sets 10 Lat pull Down  15# 2 sets 10 Seated with back support shd flex 10 x, abd 10 x 3# 2 sets. 3# OH press 10 x 2 sets 10 3# standing upright row 2 sets 10 STW to UT and rhomboids Mulligan SNAGs and NAGS to increase cerv rotation and lateral flexion  08/06/24  UBE L 1 2 min each way Red tband standing shld ext and row 2 sets 10  Red tband horz abd and ER 2 sets 10 Ball vs wall 5 x CW and 5 xCCW Wt ball standing OH ext 10 x Self stretching for traps and rhomboids STW to UT and rhomboids Cupping to RT UT and rhomboid KT tape to RT UT and rhom TP with 60-70% pull    08/04/24  NuStep L5x65mins  Shoulder rows and ext 2x10 Seated thoracic ext against half foam roll 2x10 UT stretch with 3# weight Shoulder flexion and abd 1# with foam roll against wall 2x10 Standing thoracic open book at wall 2x10 STM to upper traps, trigger point release on R UT   07/21/24 EVAL                                                                                                                                 PATIENT EDUCATION:  Education details: POC, HEP, posture education and how strengthening can help Person educated: Patient Education  method: Explanation and Demonstration Education comprehension: verbalized understanding and returned demonstration  HOME EXERCISE PROGRAM: Access Code: [email or web address] URL: [email or web address] Date: 07/21/2024 Prepared by: Almetta Fam  Exercises - Cat Cow  - 1 x daily - 7 x weekly - 2 sets - 10 reps - Thoracic Extension Mobilization on Foam Roll  - 1 x daily - 7 x weekly - 2 sets - 10 reps - Standing Shoulder Row with Anchored Resistance  - 1 x daily - 7 x weekly - 2 sets - 10 reps - Shoulder extension with resistance - Neutral  - 1 x daily - 7 x weekly - 2 sets - 10 reps - Doorway Pec Stretch at 90 Degrees Abduction  - 1 x daily - 7 x weekly - 2 reps - 15 hold - Seated Cervical Sidebending Stretch  - 1 x daily - 7 x weekly - 2 reps - 15 hold  ASSESSMENT:  CLINICAL IMPRESSION: pt arrived feeling good, no pain. She states she will renew her [employer] membership to continue with exercises after she finishes here. Advised her to scope out the [facility] and take pictures so she can return with questions and we can go over how to set up machines. Will continue over the next 2 visits working on transitioning to an independent gym program.    EVAL--She reports ongoing mid back pain for years. The pain is sharp and feels like spasms when it does occur. She reports after doing an activity that requires any slight flexion is difficulty-- this includes sowing, cooking, cleaning, etc. She has about a 10 min tolerance. Patient is limited with neck ROM, especially with lateral flexion. She also has some weakness in bilateral shoulder flexion and abduction.   OBJECTIVE IMPAIRMENTS: decreased activity tolerance, decreased ROM, decreased strength, hypomobility, increased muscle spasms, improper body mechanics, and pain.    ACTIVITY LIMITATIONS: carrying, lifting, bending, and standing  PARTICIPATION LIMITATIONS: meal prep, cleaning, laundry, community activity, and yard work  PERSONAL FACTORS: Age and Time  since onset of injury/illness/exacerbation are also affecting patient's functional outcome.   REHAB POTENTIAL: Good  CLINICAL DECISION MAKING: Stable/uncomplicated  EVALUATION COMPLEXITY: Low  GOALS: Goals reviewed with patient? Yes  SHORT TERM GOALS: Target date: 09/01/24  Patient will be independent with initial HEP.  Baseline:  Goal status: 08/06/24 MET for initial HEP  LONG TERM GOALS: Target date: 10/13/24  Patient will be independent with advanced/ongoing HEP to improve outcomes and carryover.  Baseline:  Goal status: 08/18/24 progressing  2.  Patient will report 75% improvement in back pain to improve QOL.  Baseline: 8/10 at worst Goal status: progressing 08/18/24  09/10/24 50% progressing  3.  Patient will demonstrate full pain free cervical ROM for safety with driving.  Baseline: limited side bending and rotation Goal status: progressing 08/18/24  09/10/24 met except for left rotation  4.  Patient will increase cooking, sowing, and standing activity tolerance to 30 mins  Baseline: 10 mins Goal status: MET 09/10/24  5.  Patient will improve shoulder flexion and abduction strength to 4+/5 or better   Baseline: 3+ Goal status: 08/18/24 progressing   MET 09/10/24  PLAN:  PT FREQUENCY: 1-2x/week  PT DURATION: 12 weeks  PLANNED INTERVENTIONS: 97110-Therapeutic exercises, 97530- Therapeutic activity, 97112- Neuromuscular re-education, 97535- Self Care, [id number]- Manual therapy, [id number]- Electrical stimulation (unattended), [phone]- Traction (mechanical), 20560 (1-2 muscles), 20561 (3+ muscles)- Dry Needling, Patient/Family education, Taping, Joint mobilization, Joint manipulation, Spinal manipulation, Spinal mobilization, Cryotherapy, and Moist heat  PLAN FOR NEXT SESSION: postural strengthening. STW and mobs as needed  [employer], PT 09/17/2024, 11:41 AM

## 2024-09-18 DIAGNOSIS — M722 Plantar fascial fibromatosis: Secondary | ICD-10-CM | POA: Diagnosis not present

## 2024-09-18 DIAGNOSIS — R262 Difficulty in walking, not elsewhere classified: Secondary | ICD-10-CM | POA: Diagnosis not present

## 2024-09-18 DIAGNOSIS — M7661 Achilles tendinitis, right leg: Secondary | ICD-10-CM | POA: Diagnosis not present

## 2024-09-18 DIAGNOSIS — M205X1 Other deformities of toe(s) (acquired), right foot: Secondary | ICD-10-CM | POA: Diagnosis not present

## 2024-09-18 DIAGNOSIS — M7662 Achilles tendinitis, left leg: Secondary | ICD-10-CM | POA: Diagnosis not present

## 2024-09-18 DIAGNOSIS — M205X2 Other deformities of toe(s) (acquired), left foot: Secondary | ICD-10-CM | POA: Diagnosis not present

## 2024-09-18 DIAGNOSIS — M24572 Contracture, left ankle: Secondary | ICD-10-CM | POA: Diagnosis not present

## 2024-09-18 DIAGNOSIS — M24571 Contracture, right ankle: Secondary | ICD-10-CM | POA: Diagnosis not present

## 2024-09-22 ENCOUNTER — Ambulatory Visit: Admitting: Physical Therapy

## 2024-09-22 DIAGNOSIS — M546 Pain in thoracic spine: Secondary | ICD-10-CM | POA: Diagnosis not present

## 2024-09-22 DIAGNOSIS — M6281 Muscle weakness (generalized): Secondary | ICD-10-CM

## 2024-09-22 NOTE — Therapy (Signed)
 OUTPATIENT PHYSICAL THERAPY BACK TREATMENT   Patient Name: Mary Short MRN: [id number] DOB:[birth year], 73 y.o., female Today's Date: 09/22/2024  END OF SESSION:  PT End of Session - 09/22/24 1105     Visit Number 9    Date for Recertification  10/13/24    Authorization Type HTA    PT Start Time 1105    PT Stop Time 1145    PT Time Calculation (min) 40 min             Past Medical History:  Diagnosis Date   Hyperlipidemia    Osteoporosis    Seasonal allergies    Past Surgical History:  Procedure Laterality Date   BASAL CELL CARCINOMA EXCISION     hysterectomy     Dr. MARLA Elspeth Bellman   WISDOM TOOTH EXTRACTION  1974   Patient Active Problem List   Diagnosis Date Noted   Osteoporosis 10/06/2018   Hyperlipidemia 10/06/2018   Overactive bladder 10/06/2018   Seasonal allergies 10/06/2018    PCP: Harlene An  REFERRING PROVIDER: Dorn Ned   REFERRING DIAG:  M54.9 (ICD-10-CM) - Dorsalgia, unspecified     THERAPY DIAG:  Muscle weakness (generalized)  Rationale for Evaluation and Treatment: Rehabilitation  ONSET DATE: 06/23/24 referral date  SUBJECTIVE:                                                                                                                                                                                                         SUBJECTIVE STATEMENT: made it by [employer] so I have questions  PERTINENT HISTORY:  Cervical epidural injection  Osteoporosis   PAIN:  Are you having pain? NO   PRECAUTIONS: None  RED FLAGS: None     WEIGHT BEARING RESTRICTIONS: No  FALLS:  Has patient fallen in last 6 months? No  LIVING ENVIRONMENT: Lives with: lives with their family and lives with their spouse Lives in: House/apartment    OCCUPATION: Retired  PLOF: Independent  PATIENT GOALS: a cure that is long lasting so I can continue to do activities   NEXT MD VISIT: will call after a reasonable amount of PT appts    OBJECTIVE:  Note: Objective measures were completed at Evaluation unless otherwise noted.  DIAGNOSTIC FINDINGS:  C4-C7 moderate stenosis and moderate to severe foraminal stenosis as well as loss of lordosis.   MRI of thoracic spine shows multilevel degenerative disease without significant stenosis or instability.   COGNITION: Overall cognitive status: Within functional limits for tasks assessed  SENSATION: WFL  PALPATION: TTP T8-T10, tightness in upper traps  CERVICAL ROM:   Active ROM A/PROM (deg) eval AROM 08/18/24 09/10/24   Flexion WNL WNL WNL  Extension WNL WNL WNL  Right lateral flexion 25% 25% WFl  Left lateral flexion 25% 25% WFL  Right rotation 75% WNL WNL  Left rotation 60% 75% Limited  20% pain   (Blank rows = not tested)  UPPER EXTREMITY ROM: WNL  UPPER EXTREMITY MMT: flexion and abd 3+/5 both sides, ER/IR 5/5    LUMBAR ROM:   Active ROM A/PROM (deg) eval AROM 09/10/24  Flexion WFL WFL  Extension [facility] WFL  Right lateral flexion Equal on both sides WFL  Left lateral flexion Equal on both sides WFL  Right rotation [facility] WFL  Left rotation [facility] WFL   (Blank rows = not tested)  LOWER EXTREMITY ROM: WFL   LOWER EXTREMITY MMT: 5/5 BLE   TREATMENT DATE:  09/22/24 UBE L 3 3 min each way Nustep L 5 Chest press Seated row Lat pull  Tricep  Bicep OH press   09/17/24 UBE L3 x41min each way  Shoulder ext 10# 2x10 AR press 10# 2x10 Rows and lats 25# 2x10 3 way band reaches 2x10 Overhead carry 2 laps each side 4#  STS with chest press 2x10 3 way extension 2# 2x10  09/15/24 Rows and lats 25# 2x10 Chest press 10# 2x10 Shoulder ext 10# 2x10 UBE L3 x25mins each way  Shoulder flexion to abd 2# 2x10 Farmer's carry 10# 2 laps each hand Banded flexion lifts 2x10 OHP yellow ball 2x10 Scapular squeeze green 2x10  Horizontal abd green 2x10   09/10/24 Cerv and Lumb ROM checked - see charts BIL UE MMT = and WFLa UBE L 3  fwd/3 min  back Seated row 20# 2 sets 10 Lat pull Down 20# 2 sets 10 Chest press serratus 2 sets 10 10# Upright row 15# 2 sets 10 3# bil shld flex 2 sets 10 then abd 2 sets 10 10# cable pulley ext 2 sets 10 3# 3 pt rhy stab - some pain and fatigue STW to BIl cerv and trap- tightness in traps noted  08/20/24 UBE 3 min fwd/3 min backward L 2 Seated row 20# 2 sets 10 Lat pull Down 20# 2 sets 10 Chest press serratus 2 sets 10 3# standing cane ex 15 x each direction Seated with back support shd flex 10 x, abd 10 x 3# 2 sets. 3# OH press 10 x 2 sets 10 3# standing upright row 2 sets 10 STW to UT and rhomboids Mulligan SNAGs and NAGS to increase cerv rotation and lateral flexion   08/18/24 Assess ROM and goals. Cerv ROM above in chart. Lumbar WFLs  BIL shld flex and abd 3+/5 Nustep L 5 3# standing shld ext 2 sets 10 Ball vs wall 5 x CW and 5 xCCW Seated row 15# 2 sets 10 Lat pull Down  15# 2 sets 10 Seated with back support shd flex 10 x, abd 10 x 3# 2 sets. 3# OH press 10 x 2 sets 10 3# standing upright row 2 sets 10 STW to UT and rhomboids Mulligan SNAGs and NAGS to increase cerv rotation and lateral flexion  08/06/24  UBE L 1 2 min each way Red tband standing shld ext and row 2 sets 10  Red tband horz abd and ER 2 sets 10 Ball vs wall 5 x CW and 5 xCCW Wt ball standing OH ext 10 x Self stretching for traps and rhomboids STW to UT and rhomboids  Cupping to RT UT and rhomboid KT tape to RT UT and rhom TP with 60-70% pull    08/04/24 NuStep L5x26mins  Shoulder rows and ext 2x10 Seated thoracic ext against half foam roll 2x10 UT stretch with 3# weight Shoulder flexion and abd 1# with foam roll against wall 2x10 Standing thoracic open book at wall 2x10 STM to upper traps, trigger point release on R UT   07/21/24 EVAL                                                                                                                                 PATIENT EDUCATION:  Education  details: POC, HEP, posture education and how strengthening can help Person educated: Patient Education method: Explanation and Demonstration Education comprehension: verbalized understanding and returned demonstration  HOME EXERCISE PROGRAM: Access Code: [email or web address] URL: [email or web address] Date: 07/21/2024 Prepared by: Almetta Fam  Exercises - Cat Cow  - 1 x daily - 7 x weekly - 2 sets - 10 reps - Thoracic Extension Mobilization on Foam Roll  - 1 x daily - 7 x weekly - 2 sets - 10 reps - Standing Shoulder Row with Anchored Resistance  - 1 x daily - 7 x weekly - 2 sets - 10 reps - Shoulder extension with resistance - Neutral  - 1 x daily - 7 x weekly - 2 sets - 10 reps - Doorway Pec Stretch at 90 Degrees Abduction  - 1 x daily - 7 x weekly - 2 reps - 15 hold - Seated Cervical Sidebending Stretch  - 1 x daily - 7 x weekly - 2 reps - 15 hold  ASSESSMENT:  CLINICAL IMPRESSION: answered all questions re: gym transition. All goals met so will D/C next session  EVAL--She reports ongoing mid back pain for years. The pain is sharp and feels like spasms when it does occur. She reports after doing an activity that requires any slight flexion is difficulty-- this includes sowing, cooking, cleaning, etc. She has about a 10 min tolerance. Patient is limited with neck ROM, especially with lateral flexion. She also has some weakness in bilateral shoulder flexion and abduction.   OBJECTIVE IMPAIRMENTS: decreased activity tolerance, decreased ROM, decreased strength, hypomobility, increased muscle spasms, improper body mechanics, and pain.    ACTIVITY LIMITATIONS: carrying, lifting, bending, and standing  PARTICIPATION LIMITATIONS: meal prep, cleaning, laundry, community activity, and yard work  PERSONAL FACTORS: Age and Time since onset of injury/illness/exacerbation are also affecting patient's functional outcome.   REHAB POTENTIAL: Good  CLINICAL DECISION MAKING:  Stable/uncomplicated  EVALUATION COMPLEXITY: Low  GOALS: Goals reviewed with patient? Yes  SHORT TERM GOALS: Target date: 09/01/24  Patient will be independent with initial HEP.  Baseline:  Goal status: 08/06/24 MET for initial HEP   LONG TERM GOALS: Target date: 10/13/24  Patient will be independent with advanced/ongoing HEP to improve outcomes and carryover.  Baseline:  Goal status: 08/18/24 progressing   09/22/24 MET  2.  Patient will report 75% improvement in back pain to improve QOL.  Baseline: 8/10 at worst Goal status: progressing 08/18/24  09/10/24 50% progressing   09/22/24 MET  3.  Patient will demonstrate full pain free cervical ROM for safety with driving.  Baseline: limited side bending and rotation Goal status: progressing 08/18/24  09/10/24 met except for left rotation 09/22/24 MET  4.  Patient will increase cooking, sowing, and standing activity tolerance to 30 mins  Baseline: 10 mins Goal status: MET 09/10/24  5.  Patient will improve shoulder flexion and abduction strength to 4+/5 or better   Baseline: 3+ Goal status: 08/18/24 progressing   MET 09/10/24  PLAN:  PT FREQUENCY: 1-2x/week  PT DURATION: 12 weeks  PLANNED INTERVENTIONS: 97110-Therapeutic exercises, 97530- Therapeutic activity, 97112- Neuromuscular re-education, 97535- Self Care, 02859- Manual therapy, G0283- Electrical stimulation (unattended), [phone]- Traction (mechanical), 20560 (1-2 muscles), 20561 (3+ muscles)- Dry Needling, Patient/Family education, Taping, Joint mobilization, Joint manipulation, Spinal manipulation, Spinal mobilization, Cryotherapy, and Moist heat  PLAN FOR NEXT SESSION: D/C next session   [employer]imco Bivens,ANGIE, PTA 09/22/2024, 11:06 AM

## 2024-09-23 ENCOUNTER — Other Ambulatory Visit: Payer: Self-pay | Admitting: Nurse Practitioner

## 2024-09-23 DIAGNOSIS — N3281 Overactive bladder: Secondary | ICD-10-CM

## 2024-09-23 MED ORDER — TOLTERODINE TARTRATE 2 MG PO TABS: 2.0000 mg | ORAL_TABLET | Freq: Two times a day (BID) | ORAL | 0 refills | Status: DC

## 2024-09-23 NOTE — Therapy (Signed)
 OUTPATIENT PHYSICAL THERAPY BACK TREATMENT Progress Note Reporting Period 07/21/24 to 09/23/24  See note below for Objective Data and Assessment of Progress/Goals.      Patient Name: Mary Short MRN: [id number] DOB:Feb 24, 1951, 73 y.o., female Today's Date: 09/24/2024  END OF SESSION:  PT End of Session - 09/24/24 1100     Visit Number 10    Date for Recertification  10/13/24    Authorization Type HTA    PT Start Time 1100    PT Stop Time 1130    PT Time Calculation (min) 30 min              Past Medical History:  Diagnosis Date   Hyperlipidemia    Osteoporosis    Seasonal allergies    Past Surgical History:  Procedure Laterality Date   BASAL CELL CARCINOMA EXCISION     hysterectomy     Dr. MARLA Elspeth Bellman   WISDOM TOOTH EXTRACTION  1974   Patient Active Problem List   Diagnosis Date Noted   Osteoporosis 10/06/2018   Hyperlipidemia 10/06/2018   Overactive bladder 10/06/2018   Seasonal allergies 10/06/2018    PCP: Harlene An  REFERRING PROVIDER: Dorn Ned   REFERRING DIAG:  M54.9 (ICD-10-CM) - Dorsalgia, unspecified     THERAPY DIAG:  Muscle weakness (generalized)  Rationale for Evaluation and Treatment: Rehabilitation  ONSET DATE: 06/23/24 referral date  SUBJECTIVE:                                                                                                                                                                                                         SUBJECTIVE STATEMENT: doing pretty good  PERTINENT HISTORY:  Cervical epidural injection  Osteoporosis   PAIN:  Are you having pain? NO   PRECAUTIONS: None  RED FLAGS: None     WEIGHT BEARING RESTRICTIONS: No  FALLS:  Has patient fallen in last 6 months? No  LIVING ENVIRONMENT: Lives with: lives with their family and lives with their spouse Lives in: House/apartment    OCCUPATION: Retired  PLOF: Independent  PATIENT GOALS: a cure that is long lasting  so I can continue to do activities   NEXT MD VISIT: will call after a reasonable amount of PT appts   OBJECTIVE:  Note: Objective measures were completed at Evaluation unless otherwise noted.  DIAGNOSTIC FINDINGS:  C4-C7 moderate stenosis and moderate to severe foraminal stenosis as well as loss of lordosis.   MRI of thoracic spine shows multilevel degenerative disease without significant stenosis or instability.   COGNITION: Overall cognitive status: Within  functional limits for tasks assessed  SENSATION: WFL  PALPATION: TTP T8-T10, tightness in upper traps    CERVICAL ROM:   Active ROM A/PROM (deg) eval AROM 08/18/24 09/10/24   Flexion WNL WNL WNL  Extension WNL WNL WNL  Right lateral flexion 25% 25% WFl  Left lateral flexion 25% 25% WFL  Right rotation 75% WNL WNL  Left rotation 60% 75% Limited  20% pain   (Blank rows = not tested)  UPPER EXTREMITY ROM: WNL  UPPER EXTREMITY MMT: flexion and abd 3+/5 both sides, ER/IR 5/5    LUMBAR ROM:   Active ROM A/PROM (deg) eval AROM 09/10/24  Flexion WFL WFL  Extension [facility] WFL  Right lateral flexion Equal on both sides WFL  Left lateral flexion Equal on both sides WFL  Right rotation [facility] WFL  Left rotation [facility] WFL   (Blank rows = not tested)  LOWER EXTREMITY ROM: WFL   LOWER EXTREMITY MMT: 5/5 BLE   TREATMENT DATE: 09/24/24 UBE L3x82mins each way  HEP and gym review setting up equipment  -Lats and rows 25# 2x10 -Chest press 10# 2x10 -Tricep 25# and biceps 20# 2x10 -Leg ext 10# 2x10 and curls 25# 2x10  09/22/24 UBE L 3 3 min each way Nustep L 5 Chest press Seated row Lat pull  Tricep  Bicep OH press   09/17/24 UBE L3 x76min each way  Shoulder ext 10# 2x10 AR press 10# 2x10 Rows and lats 25# 2x10 3 way band reaches 2x10 Overhead carry 2 laps each side 4#  STS with chest press 2x10 3 way extension 2# 2x10  09/15/24 Rows and lats 25# 2x10 Chest press 10# 2x10 Shoulder ext 10# 2x10 UBE L3  x72mins each way  Shoulder flexion to abd 2# 2x10 Farmer's carry 10# 2 laps each hand Banded flexion lifts 2x10 OHP yellow ball 2x10 Scapular squeeze green 2x10  Horizontal abd green 2x10   09/10/24 Cerv and Lumb ROM checked - see charts BIL UE MMT = and WFLa UBE L 3  fwd/3 min back Seated row 20# 2 sets 10 Lat pull Down 20# 2 sets 10 Chest press serratus 2 sets 10 10# Upright row 15# 2 sets 10 3# bil shld flex 2 sets 10 then abd 2 sets 10 10# cable pulley ext 2 sets 10 3# 3 pt rhy stab - some pain and fatigue STW to BIl cerv and trap- tightness in traps noted  08/20/24 UBE 3 min fwd/3 min backward L 2 Seated row 20# 2 sets 10 Lat pull Down 20# 2 sets 10 Chest press serratus 2 sets 10 3# standing cane ex 15 x each direction Seated with back support shd flex 10 x, abd 10 x 3# 2 sets. 3# OH press 10 x 2 sets 10 3# standing upright row 2 sets 10 STW to UT and rhomboids Mulligan SNAGs and NAGS to increase cerv rotation and lateral flexion   08/18/24 Assess ROM and goals. Cerv ROM above in chart. Lumbar WFLs  BIL shld flex and abd 3+/5 Nustep L 5 3# standing shld ext 2 sets 10 Ball vs wall 5 x CW and 5 xCCW Seated row 15# 2 sets 10 Lat pull Down  15# 2 sets 10 Seated with back support shd flex 10 x, abd 10 x 3# 2 sets. 3# OH press 10 x 2 sets 10 3# standing upright row 2 sets 10 STW to UT and rhomboids Mulligan SNAGs and NAGS to increase cerv rotation and lateral flexion  08/06/24  UBE L 1 2 min each way Red tband standing shld ext and row 2 sets 10  Red tband horz abd and ER 2 sets 10 Ball vs wall 5 x CW and 5 xCCW Wt ball standing OH ext 10 x Self stretching for traps and rhomboids STW to UT and rhomboids Cupping to RT UT and rhomboid KT tape to RT UT and rhom TP with 60-70% pull    08/04/24 NuStep L5x81mins  Shoulder rows and ext 2x10 Seated thoracic ext against half foam roll 2x10 UT stretch with 3# weight Shoulder flexion and abd 1# with foam roll  against wall 2x10 Standing thoracic open book at wall 2x10 STM to upper traps, trigger point release on R UT   07/21/24 EVAL                                                                                                                                 PATIENT EDUCATION:  Education details: POC, HEP, posture education and how strengthening can help Person educated: Patient Education method: Explanation and Demonstration Education comprehension: verbalized understanding and returned demonstration  HOME EXERCISE PROGRAM: Access Code: [email or web address] URL: [email or web address] Date: 07/21/2024 Prepared by: Almetta Fam  Exercises - Cat Cow  - 1 x daily - 7 x weekly - 2 sets - 10 reps - Thoracic Extension Mobilization on Foam Roll  - 1 x daily - 7 x weekly - 2 sets - 10 reps - Standing Shoulder Row with Anchored Resistance  - 1 x daily - 7 x weekly - 2 sets - 10 reps - Shoulder extension with resistance - Neutral  - 1 x daily - 7 x weekly - 2 sets - 10 reps - Doorway Pec Stretch at 90 Degrees Abduction  - 1 x daily - 7 x weekly - 2 reps - 15 hold - Seated Cervical Sidebending Stretch  - 1 x daily - 7 x weekly - 2 reps - 15 hold  ASSESSMENT:  CLINICAL IMPRESSION: reviewed all equipment that she will use at the [facility]. She was able to adjust machines and weight independently. Reports she feels good and has been doing some sowing projects without pain. Patient discharged.   EVAL--She reports ongoing mid back pain for years. The pain is sharp and feels like spasms when it does occur. She reports after doing an activity that requires any slight flexion is difficulty-- this includes sowing, cooking, cleaning, etc. She has about a 10 min tolerance. Patient is limited with neck ROM, especially with lateral flexion. She also has some weakness in bilateral shoulder flexion and abduction.   OBJECTIVE IMPAIRMENTS: decreased activity tolerance, decreased ROM, decreased strength, hypomobility,  increased muscle spasms, improper body mechanics, and pain.    ACTIVITY LIMITATIONS: carrying, lifting, bending, and standing  PARTICIPATION LIMITATIONS: meal prep, cleaning, laundry, community activity, and yard work  PERSONAL FACTORS: Age and Time since onset of injury/illness/exacerbation are also affecting patient's functional outcome.  REHAB POTENTIAL: Good  CLINICAL DECISION MAKING: Stable/uncomplicated  EVALUATION COMPLEXITY: Low  GOALS: Goals reviewed with patient? Yes  SHORT TERM GOALS: Target date: 09/01/24  Patient will be independent with initial HEP.  Baseline:  Goal status: 08/06/24 MET for initial HEP   LONG TERM GOALS: Target date: 10/13/24  Patient will be independent with advanced/ongoing HEP to improve outcomes and carryover.  Baseline:  Goal status: 08/18/24 progressing   09/22/24 MET  2.  Patient will report 75% improvement in back pain to improve QOL.  Baseline: 8/10 at worst Goal status: progressing 08/18/24  09/10/24 50% progressing   09/22/24 MET  3.  Patient will demonstrate full pain free cervical ROM for safety with driving.  Baseline: limited side bending and rotation Goal status: progressing 08/18/24  09/10/24 met except for left rotation 09/22/24 MET  4.  Patient will increase cooking, sowing, and standing activity tolerance to 30 mins  Baseline: 10 mins Goal status: MET 09/10/24  5.  Patient will improve shoulder flexion and abduction strength to 4+/5 or better   Baseline: 3+ Goal status: 08/18/24 progressing   MET 09/10/24  PLAN:  PT FREQUENCY: 1-2x/week  PT DURATION: 12 weeks  PLANNED INTERVENTIONS: 97110-Therapeutic exercises, 97530- Therapeutic activity, 97112- Neuromuscular re-education, 97535- Self Care, 02859- Manual therapy, G0283- Electrical stimulation (unattended), [phone]- Traction (mechanical), 20560 (1-2 muscles), 20561 (3+ muscles)- Dry Needling, Patient/Family education, Taping, Joint mobilization, Joint manipulation, Spinal  manipulation, Spinal mobilization, Cryotherapy, and Moist heat  PLAN FOR NEXT SESSION: d/c  PHYSICAL THERAPY DISCHARGE SUMMARY  Visits from Start of Care: 10   Patient agrees to discharge. Patient goals were met. Patient is being discharged due to meeting the stated rehab goals.    [place], PT 09/24/2024, 11:28 AM

## 2024-09-23 NOTE — Telephone Encounter (Signed)
 Copied from CRM #[id number]. Topic: Clinical - Medication Refill >> Sep 23, 2024 10:42 AM Diannia H wrote: Medication:  tolterodine  (DETROL ) 2 MG tablet   Has the patient contacted their pharmacy? Yes (Agent: If no, request that the patient contact the pharmacy for the refill. If patient does not wish to contact the pharmacy document the reason why and proceed with request.) (Agent: If yes, when and what did the pharmacy advise?)  This is the patient's preferred pharmacy:  [id number] [place], [place] - [address] [place] [address] Phone: [phone] Fax: [phone]  Is this the correct pharmacy for this prescription? Yes If no, delete pharmacy and type the correct one.   Has the prescription been filled recently? No  Is the patient out of the medication? Yes  Has the patient been seen for an appointment in the last year OR does the patient have an upcoming appointment? Yes  Can we respond through MyChart? Yes  Agent: Please be advised that Rx refills may take up to 3 business days. We ask that you follow-up with your pharmacy.

## 2024-09-24 ENCOUNTER — Ambulatory Visit

## 2024-09-24 DIAGNOSIS — M6281 Muscle weakness (generalized): Secondary | ICD-10-CM

## 2024-09-24 DIAGNOSIS — M546 Pain in thoracic spine: Secondary | ICD-10-CM | POA: Diagnosis not present

## 2024-10-13 DIAGNOSIS — M24572 Contracture, left ankle: Secondary | ICD-10-CM | POA: Diagnosis not present

## 2024-10-13 DIAGNOSIS — M24571 Contracture, right ankle: Secondary | ICD-10-CM | POA: Diagnosis not present

## 2024-10-13 DIAGNOSIS — M205X1 Other deformities of toe(s) (acquired), right foot: Secondary | ICD-10-CM | POA: Diagnosis not present

## 2024-10-13 DIAGNOSIS — M722 Plantar fascial fibromatosis: Secondary | ICD-10-CM | POA: Diagnosis not present

## 2024-10-13 DIAGNOSIS — M7662 Achilles tendinitis, left leg: Secondary | ICD-10-CM | POA: Diagnosis not present

## 2024-10-13 DIAGNOSIS — R262 Difficulty in walking, not elsewhere classified: Secondary | ICD-10-CM | POA: Diagnosis not present

## 2024-10-13 DIAGNOSIS — M7661 Achilles tendinitis, right leg: Secondary | ICD-10-CM | POA: Diagnosis not present

## 2024-10-13 DIAGNOSIS — M205X2 Other deformities of toe(s) (acquired), left foot: Secondary | ICD-10-CM | POA: Diagnosis not present

## 2024-10-23 ENCOUNTER — Ambulatory Visit: Admitting: Nurse Practitioner

## 2024-11-02 DIAGNOSIS — H2513 Age-related nuclear cataract, bilateral: Secondary | ICD-10-CM | POA: Diagnosis not present

## 2024-11-09 ENCOUNTER — Ambulatory Visit: Payer: Self-pay | Admitting: Nurse Practitioner

## 2024-11-11 ENCOUNTER — Encounter: Payer: Self-pay | Admitting: Nurse Practitioner

## 2024-11-11 ENCOUNTER — Other Ambulatory Visit: Payer: Self-pay | Admitting: Nurse Practitioner

## 2024-11-11 ENCOUNTER — Ambulatory Visit (INDEPENDENT_AMBULATORY_CARE_PROVIDER_SITE_OTHER): Admitting: Nurse Practitioner

## 2024-11-11 VITALS — BP 118/78 | HR 74 | Temp 98.1°F | Ht 65.5 in | Wt 176.0 lb

## 2024-11-11 DIAGNOSIS — K219 Gastro-esophageal reflux disease without esophagitis: Secondary | ICD-10-CM | POA: Insufficient documentation

## 2024-11-11 DIAGNOSIS — M810 Age-related osteoporosis without current pathological fracture: Secondary | ICD-10-CM

## 2024-11-11 DIAGNOSIS — E782 Mixed hyperlipidemia: Secondary | ICD-10-CM

## 2024-11-11 DIAGNOSIS — N3281 Overactive bladder: Secondary | ICD-10-CM | POA: Diagnosis not present

## 2024-11-11 DIAGNOSIS — Z23 Encounter for immunization: Secondary | ICD-10-CM | POA: Diagnosis not present

## 2024-11-11 DIAGNOSIS — E042 Nontoxic multinodular goiter: Secondary | ICD-10-CM | POA: Diagnosis not present

## 2024-11-11 MED ORDER — MIRABEGRON ER 25 MG PO TB24: 25.0000 mg | ORAL_TABLET | Freq: Every day | ORAL | 2 refills | Status: DC

## 2024-11-11 NOTE — Assessment & Plan Note (Signed)
 Osteoporosis managed with calcium  and vitamin D .  - Continue calcium  600 mg twice daily and vitamin D  2000 IU daily. -continue weight bearing exercises 30 mins 5 days a week.

## 2024-11-11 NOTE — Assessment & Plan Note (Signed)
 Detrol  caused dryness and constipation. VESIcare not tolerated.  - Prescribed Myrbetriq 25 mg daily after discontinuing Detrol . - Monitor blood pressure 1-2 times a week after starting Myrbetriq. - Contact provider if Myrbetriq is not tolerated to revert to Detrol .

## 2024-11-11 NOTE — Assessment & Plan Note (Signed)
 Cholesterol levels well-controlled with atorvastatin . Recent labs within target ranges. - Continue atorvastatin  20 mg daily.

## 2024-11-11 NOTE — Progress Notes (Signed)
 Careteam: Patient Care Team: Caro Harlene POUR, NP as PCP - General (Geriatric Medicine) Marvis Ronal BIRCH., MD as Referring Physician (Gastroenterology) Latisha Medford, MD as Consulting Physician (Obstetrics and Gynecology) Gaston Hamilton, MD as Consulting Physician (Urology) Tommas Pears, MD as Consulting Physician (Endocrinology)  PLACE OF SERVICE:  [facility]  Advanced Directive information    Allergies  Allergen Reactions   Fish Allergy     Any fish in the flounder family causes rash, difficulty breathing, swelling of eyes, itching, water/swelling under eyes   Penicillins     Swelling of the lips and a rash   Tape     Localized rash    Chief Complaint  Patient presents with   Medical Management of Chronic Issues    6 month routine follow up no concerns  Discuss Flu vaccine , COVID , TD     HPI:  Discussed the use of AI scribe software for clinical note transcription with the patient, who gave verbal consent to proceed.  History of Present Illness Mary Short is a 73 year old female who presents for a follow-up visit   She reports she is now part of a clinical trial. She is participating in the [place] Study at [facility], which investigates the effects of tirzepatide on weight loss, bone loss, and muscle loss in individuals aged 62 to 61. She has received two doses of the medication and is due for her third shot today. Her baseline weight was 180 pounds, and she currently weighs approximately 173.9 pounds. She reports having lost about 5 pounds since starting the trial. She is actively tracking her food intake and weight as part of the study protocol.  She has no current symptoms of indigestion or GERD, attributing this to her consistent use of Nexium , which she takes an hour before meals. Initially, she experienced some symptoms but adjusted her supplement intake and stopped drinking Diet Coke, which has improved her symptoms.  She is currently taking  atorvastatin  20 mg daily for cholesterol management. Her recent blood work showed an LDL of 94, triglycerides at 89, and HDL at 85. Her electrolytes, kidney, and liver functions were normal.   She is on Detrol  for overactive bladder, taking one pill in the morning and one at night. Avoiding Diet Coke and iced tea has helped reduce symptoms, but she still experiences urgency and incontinence. She wears pads regularly and has tried VESIcare in the past, which caused throat dryness.  She has a history of osteoporosis and is taking Caltrate 600 mg twice daily and vitamin D3.  She previously took alendronate but has been off it for over five years. She is not on any other osteoporosis treatments and does not wish to be at this time.   She has a stable thyroid  nodule that is no longer being actively monitored. Her endocrinologist has determined it does not require further follow-up.  She is being treated for plantar fasciitis and Achilles tendinitis in her right foot by a podiatrist. She uses orthotics and wears supportive shoes, such as Hokas and Brooks, to manage her symptoms. She engages in water aerobics and aims for 7500 steps daily.  No chest pain, palpitations, shortness of breath, cough, congestion, wheezing, pain with urination, blood in stool, vaginal bleeding, or abnormal joint aches beyond her known conditions.   Review of Systems:  Review of Systems  Constitutional:  Negative for chills, fever and weight loss.  HENT:  Negative for tinnitus.   Respiratory:  Negative for  cough, sputum production and shortness of breath.   Cardiovascular:  Negative for chest pain, palpitations and leg swelling.  Gastrointestinal:  Negative for abdominal pain, constipation, diarrhea and heartburn.  Genitourinary:  Positive for frequency and urgency. Negative for dysuria.  Musculoskeletal:  Positive for joint pain. Negative for back pain, falls and myalgias.  Skin: Negative.   Neurological:  Negative for  dizziness and headaches.  Psychiatric/Behavioral:  Negative for depression and memory loss. The patient does not have insomnia.     Past Medical History:  Diagnosis Date   Hyperlipidemia    Osteoporosis    Seasonal allergies    Past Surgical History:  Procedure Laterality Date   BASAL CELL CARCINOMA EXCISION     hysterectomy     Dr. MARLA Elspeth Bellman   WISDOM TOOTH EXTRACTION  1974   Social History:   reports that she has never smoked. She has never used smokeless tobacco. She reports current alcohol use. She reports that she does not use drugs.  Family History  Problem Relation Age of Onset   Dementia Mother 55   Osteoporosis Mother    Heart failure Father        Deceased in 2021-02-22   Atrial fibrillation Father    Sick sinus syndrome Father    Heart disease Father    Stroke Paternal Aunt    Heart attack Paternal Uncle    Breast cancer Maternal Grandmother 70   Diabetes Maternal Grandmother    Arthritis Maternal Grandfather    Dementia Maternal Grandfather    Cervical cancer Paternal Grandmother 37   BRCA 1/2 Neg Hx     Medications: Patient's Medications  New Prescriptions   MIRABEGRON ER (MYRBETRIQ) 25 MG TB24 TABLET    Take 1 tablet (25 mg total) by mouth daily.  Previous Medications   ACETAMINOPHEN (TYLENOL) 500 MG TABLET    Take 1,000 mg by mouth as needed.   ATORVASTATIN  (LIPITOR) 20 MG TABLET    TAKE 1 TABLET BY MOUTH EVERY DAY   CALCIUM  PO    Take 1 Piece by mouth daily at 12 noon.   CHOLECALCIFEROL (VITAMIN D3) 25 MCG (1000 UNIT) TABLET    Take 1,000 Units by mouth daily.   COENZYME Q10 (COQ-10 PO)    Take by mouth daily.   ESOMEPRAZOLE  (NEXIUM ) 40 MG CAPSULE    TAKE 1 CAPSULE (40 MG TOTAL) BY MOUTH DAILY.   MAGNESIUM  CITRATE SOLN    Take 1 Bottle by mouth daily.   MULTIPLE VITAMIN (MULTI-VITAMIN) TABLET    Take 1 tablet by mouth daily.   MULTIPLE VITAMINS-MINERALS (PRESERVISION AREDS PO)    Take by mouth daily. 2 tabs daily   TOLNAFTATE  (TINACTIN) 1 %  EXTERNAL SOLUTION    Apply 1 Application topically 2 (two) times daily. To use twice daily   TOLTERODINE  (DETROL ) 2 MG TABLET    Take 1 tablet (2 mg total) by mouth 2 (two) times daily.   TRAMADOL  (ULTRAM ) 50 MG TABLET    Take 1 tablet (50 mg total) by mouth daily as needed for severe pain.   VITAMIN E PO    Take by mouth daily.  Modified Medications   No medications on file  Discontinued Medications   LEVOCETIRIZINE (XYZAL) 5 MG TABLET    Take 5 mg by mouth every evening.   MAGNESIUM  GLUCONATE PO    Take 1 capsule by mouth daily at 12 noon.   OMEGA-3 FATTY ACIDS (FISH OIL PO)    Take by  mouth daily.    Physical Exam:  Vitals:   11/11/24 0839  BP: 118/78  Pulse: 74  Temp: 98.1 F (36.7 C)  SpO2: 95%  Weight: 176 lb (79.8 kg)  Height: 5' 5.5 (1.664 m)   Body mass index is 28.84 kg/m. Wt Readings from Last 3 Encounters:  11/11/24 176 lb (79.8 kg)  04/03/24 182 lb 6.4 oz (82.7 kg)  09/09/23 174 lb (78.9 kg)    Physical Exam Constitutional:      General: She is not in acute distress.    Appearance: She is well-developed. She is not diaphoretic.  HENT:     Head: Normocephalic and atraumatic.     Mouth/Throat:     Pharynx: No oropharyngeal exudate.  Eyes:     Conjunctiva/sclera: Conjunctivae normal.     Pupils: Pupils are equal, round, and reactive to light.  Cardiovascular:     Rate and Rhythm: Normal rate and regular rhythm.     Heart sounds: Normal heart sounds.  Pulmonary:     Effort: Pulmonary effort is normal.     Breath sounds: Normal breath sounds.  Abdominal:     General: Bowel sounds are normal.     Palpations: Abdomen is soft.  Musculoskeletal:     Cervical back: Normal range of motion and neck supple.     Right lower leg: No edema.     Left lower leg: No edema.  Skin:    General: Skin is warm and dry.  Neurological:     Mental Status: She is alert.  Psychiatric:        Mood and Affect: Mood normal.     Labs reviewed: Basic Metabolic  Panel: Recent Labs    04/03/24 1529  NA 140  K 4.5  CL 104  CO2 29  GLUCOSE 88  BUN 23  CREATININE 0.79  CALCIUM  9.5  TSH 1.71   Liver Function Tests: Recent Labs    04/03/24 1529  AST 19  ALT 19  BILITOT 0.6  PROT 7.0   No results for input(s): LIPASE, AMYLASE in the last 8760 hours. No results for input(s): AMMONIA in the last 8760 hours. CBC: Recent Labs    04/03/24 1529  WBC 4.0  NEUTROABS 2,304  HGB 14.3  HCT 42.2  MCV 91.1  PLT 207   Lipid Panel: No results for input(s): CHOL, HDL, LDLCALC, TRIG, CHOLHDL, LDLDIRECT in the last 8760 hours. TSH: Recent Labs    04/03/24 1529  TSH 1.71   A1C: Lab Results  Component Value Date   HGBA1C 5.2 05/13/2018     Assessment/Plan  Overactive bladder Assessment & Plan: Detrol  caused dryness and constipation. VESIcare not tolerated.  - Prescribed Myrbetriq 25 mg daily after discontinuing Detrol . - Monitor blood pressure 1-2 times a week after starting Myrbetriq. - Contact provider if Myrbetriq is not tolerated to revert to Detrol .  Orders: -     Mirabegron ER; Take 1 tablet (25 mg total) by mouth daily.  Dispense: 30 tablet; Refill: 2  Gastroesophageal reflux disease without esophagitis Assessment & Plan: GERD symptoms well-controlled with current management and dietary modifications. - Continue current GERD management regimen.  Orders: -     CBC with Differential/Platelet -     Comprehensive metabolic panel with GFR  Mixed hyperlipidemia Assessment & Plan: Cholesterol levels well-controlled with atorvastatin . Recent labs within target ranges. - Continue atorvastatin  20 mg daily.  Orders: -     Lipid panel -     Comprehensive metabolic panel with  GFR  Age-related osteoporosis without current pathological fracture Assessment & Plan: Osteoporosis managed with calcium  and vitamin D .  - Continue calcium  600 mg twice daily and vitamin D  2000 IU daily. -continue weight bearing  exercises 30 mins 5 days a week.   Orders: -     Comprehensive metabolic panel with GFR  Multiple thyroid  nodules Assessment & Plan: No ongoing follow up recommended after last US - had been stable.    Flu vaccine need -     Flu vaccine HIGH DOSE PF(Fluzone Trivalent)     Return in about 6 months (around 05/11/2025) for routine follow up, labs at time of visit.  Shaneka Efaw K. Caro BODILY [facility] [phone]

## 2024-11-11 NOTE — Assessment & Plan Note (Signed)
 GERD symptoms well-controlled with current management and dietary modifications. - Continue current GERD management regimen.

## 2024-11-11 NOTE — Assessment & Plan Note (Signed)
 No ongoing follow up recommended after last US - had been stable.

## 2024-11-13 ENCOUNTER — Other Ambulatory Visit

## 2024-11-22 ENCOUNTER — Other Ambulatory Visit: Payer: Self-pay | Admitting: Nurse Practitioner

## 2024-11-22 DIAGNOSIS — K219 Gastro-esophageal reflux disease without esophagitis: Secondary | ICD-10-CM

## 2024-11-24 ENCOUNTER — Other Ambulatory Visit (HOSPITAL_BASED_OUTPATIENT_CLINIC_OR_DEPARTMENT_OTHER)

## 2024-11-25 DIAGNOSIS — M7661 Achilles tendinitis, right leg: Secondary | ICD-10-CM | POA: Diagnosis not present

## 2024-11-25 DIAGNOSIS — M24572 Contracture, left ankle: Secondary | ICD-10-CM | POA: Diagnosis not present

## 2024-11-25 DIAGNOSIS — M722 Plantar fascial fibromatosis: Secondary | ICD-10-CM | POA: Diagnosis not present

## 2024-11-25 DIAGNOSIS — M24571 Contracture, right ankle: Secondary | ICD-10-CM | POA: Diagnosis not present

## 2024-11-28 ENCOUNTER — Other Ambulatory Visit: Payer: Self-pay | Admitting: Nurse Practitioner

## 2024-11-28 DIAGNOSIS — E782 Mixed hyperlipidemia: Secondary | ICD-10-CM

## 2024-12-28 ENCOUNTER — Telehealth: Payer: Self-pay

## 2024-12-28 DIAGNOSIS — N3281 Overactive bladder: Secondary | ICD-10-CM

## 2024-12-28 NOTE — Telephone Encounter (Unsigned)
 Copied from CRM #[id number]. Topic: General - Other >> Dec 28, 2024 12:25 PM Mary Short wrote: Reason for CRM: Patient was calling to let provider know that she is wanting to stay on Mirabegroner. Could you assist? Patients callback number is [phone].

## 2024-12-28 NOTE — Telephone Encounter (Signed)
 Has she been monitoring bp at home? If bp has been normal okay to continue medication

## 2024-12-29 NOTE — Telephone Encounter (Signed)
 Spoke with patient, patient states she had forgotten to take B/P, however she did check it today and it was 120/72. Patient plans to be consistent with checking b/o routinely.   Patient states she will need a rx sent to the pharmacy if Mary Harlene POUR, NP would like her to continue

## 2024-12-30 MED ORDER — MIRABEGRON ER 25 MG PO TB24: 25.0000 mg | ORAL_TABLET | Freq: Every day | ORAL | 1 refills | Status: AC

## 2024-12-30 NOTE — Telephone Encounter (Signed)
"  Patient notified   "

## 2024-12-30 NOTE — Telephone Encounter (Signed)
 Rx sent to pharmacy

## 2025-01-14 ENCOUNTER — Ambulatory Visit: Payer: Self-pay

## 2025-01-14 NOTE — Telephone Encounter (Signed)
 Please see note from triage nurse, Patient does have an appointment with another provider to seen in office on next Monday.  Please Advise

## 2025-01-14 NOTE — Telephone Encounter (Signed)
 Left detail voicemail message in regards to the response from Caro Harlene POUR, NP   would recommend follow up with urgent care if pain severe and unable to wait until appt    [facility] for E2C2 to share providers response when patient returns call

## 2025-01-14 NOTE — Telephone Encounter (Signed)
" °  FYI Only or Action Required?: Action required by provider: update on patient condition. Patient out of town, UC or ED advised  Patient was last seen in primary care on 11/11/2024 by Mary Harlene POUR, NP.  Called Nurse Triage reporting Back Pain.  Symptoms began several days ago.  Interventions attempted: OTC medications: tylenol and ibuprofen.  Symptoms are: unchanged.  Triage Disposition: See HCP Within 4 Hours (Or PCP Triage)  Patient/caregiver understands and will follow disposition?: Unsure Copied from CRM [id number]. Topic: Clinical - Red Word Triage >> Jan 14, 2025  2:46 PM Chiquita SQUIBB wrote: Red Word that prompted transfer to Nurse Triage: Patient is calling in stating that has has thrown her back out while traveling and has not been able to leave bed in two days. Patients states she is in severe pain. Reason for Disposition  [1] SEVERE back pain (e.g., excruciating, unable to do any normal activities) AND [2] not improved 2 hours after pain medicine  Answer Assessment - Initial Assessment Questions 1. ONSET: When did the pain begin? (e.g., minutes, hours, days)     Three days ago 2. LOCATION: Where does it hurt? (upper, mid or lower back)     Mid back on the right 3. SEVERITY: How bad is the pain?  (e.g., Scale 1-10; mild, moderate, or severe)     10/10 4. PATTERN: Is the pain constant? (e.g., yes, no; constant, intermittent)      Constant ache with moments of zap 5. RADIATION: Does the pain shoot into your legs or somewhere else?     Radiated down right side of back 6. CAUSE:  What do you think is causing the back pain?      unsure 7. BACK OVERUSE:  Any recent lifting of heavy objects, strenuous work or exercise?     denies 8. MEDICINES: What have you taken so far for the pain? (e.g., nothing, acetaminophen, NSAIDS)     Tylenol and ibuprofen, no relief 9. NEUROLOGIC SYMPTOMS: Do you have any weakness, numbness, or problems with bowel/bladder  control?     denies 10. OTHER SYMPTOMS: Do you have any other symptoms? (e.g., fever, abdomen pain, burning with urination, blood in urine)      Mild abdominal pain due to hunger  Protocols used: Back Pain-A-AH  "

## 2025-01-14 NOTE — Telephone Encounter (Signed)
 Agree would recommend follow up with urgent care if pain severe and unable to wait until appt

## 2025-01-18 ENCOUNTER — Ambulatory Visit: Admitting: Adult Health

## 2025-01-18 ENCOUNTER — Encounter: Payer: Self-pay | Admitting: Adult Health

## 2025-01-18 VITALS — BP 118/80 | HR 100 | Temp 97.7°F | Ht 65.5 in | Wt 164.8 lb

## 2025-01-18 DIAGNOSIS — M5489 Other dorsalgia: Secondary | ICD-10-CM

## 2025-01-18 DIAGNOSIS — N3281 Overactive bladder: Secondary | ICD-10-CM

## 2025-01-18 LAB — POCT URINALYSIS DIPSTICK
Glucose, UA: NEGATIVE
Leukocytes, UA: NEGATIVE
Nitrite, UA: NEGATIVE
Protein, UA: POSITIVE — AB
Spec Grav, UA: 1.015
Urobilinogen, UA: 1 U/dL
pH, UA: 6.5

## 2025-01-18 NOTE — Progress Notes (Unsigned)
 "  [facility]  Provider:  Jereld Serum DNP  Code Status:  Full Code  Goals of Care:     07/21/2024    1:48 PM  Advanced Directives  Does Patient Have a Medical Advance Directive? No  Would patient like information on creating a medical advance directive? No - Patient declined     Chief Complaint  Patient presents with   Back Pain    Discussed the use of AI scribe software for clinical note transcription with the patient, who gave verbal consent to proceed.  HPI: Patient is a 74 y.o. female seen today for an acute visit for mid back pain.   She has been experiencing severe middle back pain that began last Tuesday while in [place] . The pain was initially debilitating, rated as 10 out of 10, causing her to lay on the floor for three days to apply pressure to her back. It has since decreased to a 2 out of 10. The pain radiated from the middle back, starting under the bra line and extending across the ribs on the right side. She has a history of back spasms occurring a couple of times a year.  She visited an outpatient clinic in [place]  where she was prescribed baclofen 5 mg to be taken three times daily for five days. The medication helped her sleep by the second day of use. She has not taken baclofen today due to concerns about potential testing. She was also given a small number of tramadol  tablets for back spasms prior to her trip, which she used sparingly.  She experienced severe diarrhea after consuming rich foods on Saturday and Monday nights, which was followed by the onset of back pain on Tuesday morning. Her husband suggested she might have kidney stones and advised her to drink plenty of fluids, which she did, leading to a reduction in pain by Saturday.  She has a history of overactive bladder and is currently taking Myrbetriq  25 mg daily. She noticed an increase in urinary frequency but with reduced volume, which was different from her usual pattern. No pain during  urination, fever, or chills, although she experienced shakes when trying to eat.  She does not consume alcohol or smoke. She has been making an effort to increase her water intake, especially when taking her medications.    Past Medical History:  Diagnosis Date   Hyperlipidemia    Osteoporosis    Seasonal allergies     Past Surgical History:  Procedure Laterality Date   BASAL CELL CARCINOMA EXCISION     hysterectomy     Dr. MARLA Elspeth Bellman   WISDOM TOOTH EXTRACTION  1974    Allergies[1]  Outpatient Encounter Medications as of 01/18/2025  Medication Sig   acetaminophen (TYLENOL) 500 MG tablet Take 1,000 mg by mouth as needed.   atorvastatin  (LIPITOR) 20 MG tablet TAKE 1 TABLET BY MOUTH EVERY DAY   Baclofen 5 MG TABS Take 1 tablet by mouth 3 (three) times daily.   Calcium  Carb-Cholecalciferol (CALTRATE 600+D3 SOFT) 600-20 MG-MCG CHEW Chew 1 tablet by mouth daily.   CALCIUM  PO Take 1 Piece by mouth daily at 12 noon.   cholecalciferol (VITAMIN D3) 25 MCG (1000 UNIT) tablet Take 1,000 Units by mouth daily.   Coenzyme Q10 (COQ-10 PO) Take by mouth daily.   esomeprazole  (NEXIUM ) 40 MG capsule TAKE 1 CAPSULE (40 MG TOTAL) BY MOUTH DAILY.   ibuprofen (ADVIL) 200 MG tablet Take 200 mg by mouth every 6 (six) hours as needed.  levocetirizine (XYZAL) 5 MG tablet Take 5 mg by mouth every evening.   magnesium  citrate SOLN Take 1 Bottle by mouth daily.   mirabegron  ER (MYRBETRIQ ) 25 MG TB24 tablet Take 1 tablet (25 mg total) by mouth daily.   Multiple Vitamin (MULTI-VITAMIN) tablet Take 1 tablet by mouth daily.   Multiple Vitamins-Minerals (PRESERVISION AREDS PO) Take by mouth daily. 2 tabs daily   tirzepatide (ZEPBOUND) 5 MG/0.5ML Pen Inject 5 mg into the skin once a week.   tolnaftate  (TINACTIN) 1 % external solution Apply 1 Application topically 2 (two) times daily. To use twice daily   VITAMIN E PO Take by mouth daily.   pantoprazole  (PROTONIX ) 40 MG tablet Take 40 mg by mouth daily.  (Patient not taking: Reported on 01/18/2025)   traMADol  (ULTRAM ) 50 MG tablet Take 1 tablet (50 mg total) by mouth daily as needed for severe pain. (Patient not taking: Reported on 01/18/2025)   No facility-administered encounter medications on file as of 01/18/2025.    Review of Systems:  Review of Systems  Constitutional:  Negative for appetite change, chills, fatigue and fever.  HENT:  Negative for congestion, hearing loss, rhinorrhea and sore throat.   Eyes: Negative.   Respiratory:  Negative for cough, shortness of breath and wheezing.   Cardiovascular:  Negative for chest pain, palpitations and leg swelling.  Gastrointestinal:  Negative for abdominal pain, constipation, diarrhea, nausea and vomiting.  Genitourinary:  Negative for dysuria.  Musculoskeletal:  Positive for back pain. Negative for arthralgias and myalgias.  Skin:  Negative for color change, rash and wound.  Neurological:  Negative for dizziness, weakness and headaches.  Psychiatric/Behavioral:  Negative for behavioral problems. The patient is not nervous/anxious.     Health Maintenance  Topic Date Due   Medicare Annual Wellness (AWV)  03/12/2025   COVID-19 Vaccine (8 - 2025-26 season) 02/03/2025 (Originally 08/31/2024)   DTaP/Tdap/Td (2 - Td or Tdap) 01/18/2026 (Originally 10/29/2024)   Mammogram  11/24/2025   Colonoscopy  10/13/2028   Pneumococcal Vaccine: 50+ Years  Completed   Influenza Vaccine  Completed   Bone Density Scan  Completed   Hepatitis C Screening  Completed   Zoster Vaccines- Shingrix  Completed   Meningococcal B Vaccine  Aged Out    Physical Exam: Vitals:   01/18/25 1312  BP: 118/80  Pulse: 100  Temp: 97.7 F (36.5 C)  SpO2: 98%  Weight: 164 lb 12.8 oz (74.8 kg)  Height: 5' 5.5 (1.664 m)   Body mass index is 27.01 kg/m. Physical Exam Constitutional:      Appearance: Normal appearance.  HENT:     Head: Normocephalic and atraumatic.     Nose: Nose normal.     Mouth/Throat:      Mouth: Mucous membranes are moist.  Eyes:     Conjunctiva/sclera: Conjunctivae normal.  Cardiovascular:     Rate and Rhythm: Normal rate and regular rhythm.  Pulmonary:     Effort: Pulmonary effort is normal.     Breath sounds: Normal breath sounds.  Abdominal:     General: Bowel sounds are normal.     Palpations: Abdomen is soft.  Musculoskeletal:        General: Normal range of motion.     Cervical back: Normal range of motion.  Skin:    General: Skin is warm and dry.  Neurological:     General: No focal deficit present.     Mental Status: She is alert and oriented to person, place, and time.  Psychiatric:        Mood and Affect: Mood normal.        Behavior: Behavior normal.        Thought Content: Thought content normal.        Judgment: Judgment normal.     Labs reviewed: Basic Metabolic Panel: Recent Labs    04/03/24 1529  NA 140  K 4.5  CL 104  CO2 29  GLUCOSE 88  BUN 23  CREATININE 0.79  CALCIUM  9.5  TSH 1.71   Liver Function Tests: Recent Labs    04/03/24 1529  AST 19  ALT 19  BILITOT 0.6  PROT 7.0   No results for input(s): LIPASE, AMYLASE in the last 8760 hours. No results for input(s): AMMONIA in the last 8760 hours. CBC: Recent Labs    04/03/24 1529  WBC 4.0  NEUTROABS 2,304  HGB 14.3  HCT 42.2  MCV 91.1  PLT 207   Lipid Panel: No results for input(s): CHOL, HDL, LDLCALC, TRIG, CHOLHDL, LDLDIRECT in the last 8760 hours. Lab Results  Component Value Date   HGBA1C 5.2 05/13/2018    Procedures since last visit: No results found.  Assessment/Plan  1. Other acute back pain (Primary) -  Pain radiating to right lower back and ribs, initially severe, now mild with stiffness. - Ordered BMP, CBC, and urine analysis to check for UTI and kidney function. - Advised taking baclofen as needed, preferably before bed, due to sedative effects. - Encouraged increased fluid intake to prevent dehydration - POC Urinalysis  Dipstick - Culture, Urine - CBC with Differential/Platelets - Basic Metabolic Panel  2. OAB (overactive bladder) -  Managed with Myrbetriq  25 mg daily. Recent increase in urinary frequency with small volumes, possibly indicating urinary retention or UTI. - Ordered urine analysis and culture to rule out UTI. - Continue Myrbetriq  25 mg daily.     Labs/tests ordered:  BMP, CBC, POC urinalysis dipstick, urine culture   Return if symptoms worsen or fail to improve.  Mansour Balboa Medina-Vargas, NP     [1]  Allergies Allergen Reactions   Fish Allergy     Any fish in the flounder family causes rash, difficulty breathing, swelling of eyes, itching, water/swelling under eyes   Penicillins     Swelling of the lips and a rash   Tape     Localized rash   "

## 2025-01-18 NOTE — Patient Instructions (Signed)
 Due for covid and tetanus vaccines. Can obtain at local pharmacy.

## 2025-01-19 LAB — CBC WITH DIFFERENTIAL/PLATELET
Absolute Lymphocytes: 812 {cells}/uL — ABNORMAL LOW (ref 850–3900)
Absolute Monocytes: 340 {cells}/uL (ref 200–950)
Basophils Absolute: 12 {cells}/uL (ref 0–200)
Basophils Relative: 0.3 %
Eosinophils Absolute: 92 {cells}/uL (ref 15–500)
Eosinophils Relative: 2.3 %
HCT: 43.1 % (ref 35.9–46.0)
Hemoglobin: 14.6 g/dL (ref 11.7–15.5)
MCH: 31.2 pg (ref 27.0–33.0)
MCHC: 33.9 g/dL (ref 31.6–35.4)
MCV: 92.1 fL (ref 81.4–101.7)
MPV: 8.9 fL (ref 7.5–12.5)
Monocytes Relative: 8.5 %
Neutro Abs: 2744 {cells}/uL (ref 1500–7800)
Neutrophils Relative %: 68.6 %
Platelets: 207 Thousand/uL (ref 140–400)
RBC: 4.68 Million/uL (ref 3.80–5.10)
RDW: 12.5 % (ref 11.0–15.0)
Total Lymphocyte: 20.3 %
WBC: 4 Thousand/uL (ref 3.8–10.8)

## 2025-01-19 LAB — BASIC METABOLIC PANEL WITH GFR
BUN: 15 mg/dL (ref 7–25)
CO2: 30 mmol/L (ref 20–32)
Calcium: 9.6 mg/dL (ref 8.6–10.4)
Chloride: 103 mmol/L (ref 98–110)
Creat: 0.87 mg/dL (ref 0.60–1.00)
Glucose, Bld: 127 mg/dL (ref 65–139)
Potassium: 4.1 mmol/L (ref 3.5–5.3)
Sodium: 141 mmol/L (ref 135–146)
eGFR: 71 mL/min/1.73m2

## 2025-01-19 LAB — URINE CULTURE
MICRO NUMBER:: 17487325
Result:: NO GROWTH
SPECIMEN QUALITY:: ADEQUATE

## 2025-01-21 ENCOUNTER — Ambulatory Visit: Payer: Self-pay | Admitting: Adult Health

## 2025-01-21 NOTE — Progress Notes (Signed)
-    Discussed lab result with patient. She stated that her back pain is better and manageable. She is currently in [place]  and does not think x-ray of back is needed at this time. She was appreciative of the phone call.

## 2025-03-16 ENCOUNTER — Encounter: Payer: Self-pay | Admitting: Nurse Practitioner

## 2025-03-19 ENCOUNTER — Ambulatory Visit: Admitting: Nurse Practitioner

## 2025-04-23 ENCOUNTER — Ambulatory Visit: Admitting: Nurse Practitioner

## 2025-05-14 ENCOUNTER — Ambulatory Visit: Admitting: Nurse Practitioner
